# Patient Record
Sex: Female | Born: 1980 | Race: Black or African American | Hispanic: No | Marital: Single | Smoking: Current every day smoker
Health system: Southern US, Community
[De-identification: ages and names within clinical notes are randomized; demographics above are authoritative.]

## PROBLEM LIST (undated history)

## (undated) DIAGNOSIS — T1490XA Injury, unspecified, initial encounter: Secondary | ICD-10-CM

## (undated) DIAGNOSIS — D573 Sickle-cell trait: Secondary | ICD-10-CM

## (undated) DIAGNOSIS — F17200 Nicotine dependence, unspecified, uncomplicated: Secondary | ICD-10-CM

## (undated) DIAGNOSIS — D571 Sickle-cell disease without crisis: Secondary | ICD-10-CM

## (undated) DIAGNOSIS — N390 Urinary tract infection, site not specified: Secondary | ICD-10-CM

## (undated) HISTORY — DX: Sickle-cell disease without crisis: D57.1

## (undated) HISTORY — DX: Injury, unspecified, initial encounter: T14.90XA

## (undated) HISTORY — PX: TUBAL LIGATION: SHX77

## (undated) HISTORY — PX: APPENDECTOMY: SHX54

---

## 2007-08-06 ENCOUNTER — Emergency Department (HOSPITAL_COMMUNITY): Admission: EM | Admit: 2007-08-06 | Discharge: 2007-08-06 | Payer: Self-pay | Admitting: Emergency Medicine

## 2008-10-05 ENCOUNTER — Encounter: Payer: Self-pay | Admitting: Family Medicine

## 2008-11-08 ENCOUNTER — Emergency Department (HOSPITAL_COMMUNITY): Admission: EM | Admit: 2008-11-08 | Discharge: 2008-11-08 | Payer: Self-pay | Admitting: Family Medicine

## 2008-11-10 ENCOUNTER — Ambulatory Visit: Payer: Self-pay | Admitting: Family Medicine

## 2008-11-10 ENCOUNTER — Inpatient Hospital Stay (HOSPITAL_COMMUNITY): Admission: EM | Admit: 2008-11-10 | Discharge: 2008-11-12 | Payer: Self-pay | Admitting: Emergency Medicine

## 2008-11-20 ENCOUNTER — Telehealth: Payer: Self-pay | Admitting: Family Medicine

## 2008-12-14 ENCOUNTER — Ambulatory Visit: Payer: Self-pay | Admitting: Family Medicine

## 2008-12-14 DIAGNOSIS — D649 Anemia, unspecified: Secondary | ICD-10-CM | POA: Insufficient documentation

## 2008-12-14 DIAGNOSIS — K089 Disorder of teeth and supporting structures, unspecified: Secondary | ICD-10-CM | POA: Insufficient documentation

## 2009-03-14 ENCOUNTER — Telehealth: Payer: Self-pay | Admitting: *Deleted

## 2009-04-20 ENCOUNTER — Telehealth: Payer: Self-pay | Admitting: Family Medicine

## 2009-04-20 ENCOUNTER — Ambulatory Visit: Payer: Self-pay | Admitting: Family Medicine

## 2009-04-21 ENCOUNTER — Emergency Department (HOSPITAL_COMMUNITY): Admission: EM | Admit: 2009-04-21 | Discharge: 2009-04-21 | Payer: Self-pay | Admitting: Emergency Medicine

## 2009-05-28 ENCOUNTER — Encounter: Payer: Self-pay | Admitting: Family Medicine

## 2009-05-29 ENCOUNTER — Ambulatory Visit: Payer: Self-pay | Admitting: Family Medicine

## 2009-05-29 ENCOUNTER — Encounter: Payer: Self-pay | Admitting: Family Medicine

## 2009-05-29 DIAGNOSIS — N912 Amenorrhea, unspecified: Secondary | ICD-10-CM | POA: Insufficient documentation

## 2009-05-29 DIAGNOSIS — R61 Generalized hyperhidrosis: Secondary | ICD-10-CM | POA: Insufficient documentation

## 2009-05-29 LAB — CONVERTED CEMR LAB
ALT: <8 units/L (ref 0–35)
AST: 14 units/L (ref 0–37)
Albumin: 3.9 g/dL (ref 3.5–5.2)
Beta hcg, urine, semiquantitative: NEGATIVE
Calcium: 9.1 mg/dL (ref 8.4–10.5)
Chloride: 104 meq/L (ref 96–112)
Eosinophils Relative: 1 % (ref 0–5)
HCT: 33.6 % — ABNORMAL LOW (ref 36.0–46.0)
Lymphocytes Relative: 30 % (ref 12–46)
Lymphs Abs: 1.8 10*3/uL (ref 0.7–4.0)
Neutro Abs: 3.6 10*3/uL (ref 1.7–7.7)
Neutrophils Relative %: 61 % (ref 43–77)
Platelets: 373 10*3/uL (ref 150–400)
Potassium: 3.6 meq/L (ref 3.5–5.3)
Sodium: 140 meq/L (ref 135–145)
Total Protein: 7.6 g/dL (ref 6.0–8.3)
WBC: 5.9 10*3/uL (ref 4.0–10.5)

## 2009-05-30 ENCOUNTER — Encounter: Payer: Self-pay | Admitting: Family Medicine

## 2009-05-31 ENCOUNTER — Encounter: Payer: Self-pay | Admitting: Family Medicine

## 2009-05-31 ENCOUNTER — Telehealth: Payer: Self-pay | Admitting: Family Medicine

## 2009-06-05 ENCOUNTER — Telehealth (INDEPENDENT_AMBULATORY_CARE_PROVIDER_SITE_OTHER): Payer: Self-pay | Admitting: *Deleted

## 2009-07-16 ENCOUNTER — Encounter (INDEPENDENT_AMBULATORY_CARE_PROVIDER_SITE_OTHER): Payer: Self-pay | Admitting: *Deleted

## 2009-07-16 DIAGNOSIS — F17200 Nicotine dependence, unspecified, uncomplicated: Secondary | ICD-10-CM

## 2009-07-16 HISTORY — DX: Nicotine dependence, unspecified, uncomplicated: F17.200

## 2009-08-07 ENCOUNTER — Emergency Department (HOSPITAL_COMMUNITY): Admission: EM | Admit: 2009-08-07 | Discharge: 2009-08-07 | Payer: Self-pay | Admitting: Emergency Medicine

## 2010-08-13 NOTE — Miscellaneous (Signed)
Summary: Tobacco Cheryl Mcfarland  Clinical Lists Changes  Problems: Added new problem of TOBACCO Cheryl Mcfarland (ICD-305.1) 

## 2010-09-16 ENCOUNTER — Inpatient Hospital Stay (INDEPENDENT_AMBULATORY_CARE_PROVIDER_SITE_OTHER)
Admission: RE | Admit: 2010-09-16 | Discharge: 2010-09-16 | Disposition: A | Discharge status: Home / Self Care | Payer: Self-pay | Source: Ambulatory Visit | Attending: Family Medicine | Admitting: Family Medicine

## 2010-09-16 DIAGNOSIS — Z331 Pregnant state, incidental: Secondary | ICD-10-CM

## 2010-09-16 LAB — POCT URINALYSIS DIPSTICK
Hgb urine dipstick: NEGATIVE
Nitrite: NEGATIVE
Urobilinogen, UA: 0.2 mg/dL (ref 0.0–1.0)
pH: 6.5 (ref 5.0–8.0)

## 2010-10-08 ENCOUNTER — Inpatient Hospital Stay (HOSPITAL_COMMUNITY): Payer: Medicaid Other

## 2010-10-08 ENCOUNTER — Inpatient Hospital Stay (HOSPITAL_COMMUNITY)
Admission: AD | Admit: 2010-10-08 | Discharge: 2010-10-08 | Disposition: A | Discharge status: Home / Self Care | Payer: Medicaid Other | Source: Ambulatory Visit | Attending: Obstetrics & Gynecology | Admitting: Obstetrics & Gynecology

## 2010-10-08 DIAGNOSIS — O209 Hemorrhage in early pregnancy, unspecified: Secondary | ICD-10-CM

## 2010-10-08 DIAGNOSIS — N760 Acute vaginitis: Secondary | ICD-10-CM | POA: Insufficient documentation

## 2010-10-08 DIAGNOSIS — A499 Bacterial infection, unspecified: Secondary | ICD-10-CM | POA: Insufficient documentation

## 2010-10-08 DIAGNOSIS — O2390 Unspecified genitourinary tract infection in pregnancy, unspecified trimester: Secondary | ICD-10-CM

## 2010-10-08 DIAGNOSIS — B9689 Other specified bacterial agents as the cause of diseases classified elsewhere: Secondary | ICD-10-CM | POA: Insufficient documentation

## 2010-10-08 LAB — ABO/RH: ABO/RH(D): O POS

## 2010-10-08 LAB — WET PREP, GENITAL

## 2010-10-09 LAB — GC/CHLAMYDIA PROBE AMP, GENITAL: GC Probe Amp, Genital: NEGATIVE

## 2010-10-22 LAB — CBC
Hemoglobin: 10 g/dL — ABNORMAL LOW (ref 12.0–15.0)
MCHC: 33 g/dL (ref 30.0–36.0)
Platelets: 297 10*3/uL (ref 150–400)
RBC: 4.32 MIL/uL (ref 3.87–5.11)
RDW: 17.5 % — ABNORMAL HIGH (ref 11.5–15.5)
WBC: 8.5 10*3/uL (ref 4.0–10.5)

## 2010-10-22 LAB — HEMOGLOBIN A1C
Hgb A1c MFr Bld: 5.3 % (ref 4.6–6.1)
Mean Plasma Glucose: 105 mg/dL

## 2010-10-23 ENCOUNTER — Inpatient Hospital Stay (HOSPITAL_COMMUNITY)
Admission: AD | Admit: 2010-10-23 | Discharge: 2010-10-23 | Disposition: A | Discharge status: Home / Self Care | Payer: Medicaid Other | Source: Ambulatory Visit | Attending: Obstetrics & Gynecology | Admitting: Obstetrics & Gynecology

## 2010-10-23 ENCOUNTER — Inpatient Hospital Stay (HOSPITAL_COMMUNITY): Payer: Medicaid Other

## 2010-10-23 DIAGNOSIS — O209 Hemorrhage in early pregnancy, unspecified: Secondary | ICD-10-CM | POA: Insufficient documentation

## 2010-10-23 LAB — CBC
Hemoglobin: 11.4 g/dL — ABNORMAL LOW (ref 12.0–15.0)
MCHC: 33.1 g/dL (ref 30.0–36.0)
RBC: 4.54 MIL/uL (ref 3.87–5.11)
WBC: 9.5 10*3/uL (ref 4.0–10.5)

## 2010-10-23 LAB — DIFFERENTIAL
Lymphocytes Relative: 21 % (ref 12–46)
Lymphs Abs: 1.9 10*3/uL (ref 0.7–4.0)
Monocytes Absolute: 0.9 10*3/uL (ref 0.1–1.0)
Monocytes Relative: 10 % (ref 3–12)
Neutro Abs: 6.5 10*3/uL (ref 1.7–7.7)
Neutrophils Relative %: 69 % (ref 43–77)

## 2010-10-23 LAB — BASIC METABOLIC PANEL
CO2: 27 mEq/L (ref 19–32)
Calcium: 9 mg/dL (ref 8.4–10.5)
Creatinine, Ser: 0.84 mg/dL (ref 0.4–1.2)
GFR calc Af Amer: >60 mL/min (ref >60)
GFR calc non Af Amer: >60 mL/min (ref >60)
Sodium: 135 mEq/L (ref 135–145)

## 2010-10-23 LAB — HEMOGLOBIN A1C: Mean Plasma Glucose: 105 mg/dL

## 2010-10-23 LAB — POCT PREGNANCY, URINE: Preg Test, Ur: NEGATIVE

## 2010-11-15 ENCOUNTER — Ambulatory Visit (INDEPENDENT_AMBULATORY_CARE_PROVIDER_SITE_OTHER): Payer: Medicaid Other | Admitting: Sports Medicine

## 2010-11-15 ENCOUNTER — Other Ambulatory Visit: Payer: Medicaid Other

## 2010-11-15 ENCOUNTER — Encounter: Payer: Self-pay | Admitting: Sports Medicine

## 2010-11-15 VITALS — BP 128/88 | HR 85 | Temp 97.6°F | Ht 62.0 in | Wt 235.0 lb

## 2010-11-15 DIAGNOSIS — Z331 Pregnant state, incidental: Secondary | ICD-10-CM

## 2010-11-15 DIAGNOSIS — O219 Vomiting of pregnancy, unspecified: Secondary | ICD-10-CM

## 2010-11-15 LAB — POCT UA - MICROSCOPIC ONLY: Epithelial cells, urine per micros: >20

## 2010-11-15 LAB — POCT URINALYSIS DIPSTICK
Bilirubin, UA: NEGATIVE
Glucose, UA: NEGATIVE
Ketones, UA: NEGATIVE
Leukocytes, UA: NEGATIVE
Nitrite, UA: NEGATIVE

## 2010-11-15 MED ORDER — PRENATAL VITAMINS 0.8 MG PO TABS: 1.0000 | ORAL_TABLET | Freq: Every day | ORAL | Status: AC

## 2010-11-15 MED ORDER — ONDANSETRON 4 MG PO TBDP: 4.0000 mg | ORAL_TABLET | Freq: Three times a day (TID) | ORAL | Status: AC | PRN

## 2010-11-15 NOTE — Patient Instructions (Addendum)
Great to see you, Come back to see Dr. Rivka Safer for your prenatal care. Zofran for nausea. Avoid steaks when nauseated. Checking urine, will let you know if anything is abnormal. Take your prenatal vitamins. Come back if vaginal bleeding, leakage.  Ihor Austin. Benjamin Stain, M.D.

## 2010-11-15 NOTE — Assessment & Plan Note (Addendum)
Suspect mild hyperemesis gravidarum. Pt able to eat t-bone steak recently. Zofran ODT. Small meals. Handout given re: hyperemesis gravidarum. RTC prn.

## 2010-11-15 NOTE — Progress Notes (Signed)
Prenatal labs done today Marqui Formby 

## 2010-11-15 NOTE — Progress Notes (Addendum)
  Subjective:    Patient ID: Cheryl Mcfarland, female    DOB: 1980/09/05, 30 y.o.   MRN: [id number]  HPI 30 yo female with N/V.  Pregnant, 13.3 weeks.  Eating steak, able to keep down PO fluids.  Prenatal labs drawn today.  Taking PNV, has appt with Dr. Rivka Safer for prenatal care.  No fevers/chills, dysuria.  No vaginal bleeding, leakage, feeling baby move, no CTX.  Had OB US recently:  EDD: 05/20/11.   Review of Systems    See HPI Objective:   Physical Exam  Constitutional: She appears well-developed and well-nourished. No distress.  Abdominal: Soft. Bowel sounds are normal. She exhibits no distension and no mass. There is no tenderness. There is no rebound and no guarding.  Skin: Skin is warm and dry.   FHR: 145.       Assessment & Plan:

## 2010-11-15 NOTE — Progress Notes (Signed)
Addended by: [place], Ewell Benassi on: 11/15/2010 04:42 PM   Modules accepted: Orders

## 2010-11-16 LAB — OBSTETRIC PANEL
Antibody Screen: NEGATIVE
Basophils Absolute: 0 10*3/uL (ref 0.0–0.1)
Eosinophils Relative: 0 % (ref 0–5)
HCT: 35.6 % — ABNORMAL LOW (ref 36.0–46.0)
Hemoglobin: 12 g/dL (ref 12.0–15.0)
Lymphocytes Relative: 23 % (ref 12–46)
Lymphs Abs: 1.6 10*3/uL (ref 0.7–4.0)
MCV: 77.6 fL — ABNORMAL LOW (ref 78.0–100.0)
Monocytes Absolute: 0.8 10*3/uL (ref 0.1–1.0)
Neutro Abs: 4.4 10*3/uL (ref 1.7–7.7)
RBC: 4.59 MIL/uL (ref 3.87–5.11)
RDW: 16.2 % — ABNORMAL HIGH (ref 11.5–15.5)
Rubella: 50.6 IU/mL — ABNORMAL HIGH
WBC: 6.8 10*3/uL (ref 4.0–10.5)

## 2010-11-16 LAB — HIV ANTIBODY (ROUTINE TESTING W REFLEX): HIV: NONREACTIVE

## 2010-11-17 LAB — CULTURE, OB URINE: Organism ID, Bacteria: NO GROWTH

## 2010-11-19 ENCOUNTER — Other Ambulatory Visit (HOSPITAL_COMMUNITY)
Admission: RE | Admit: 2010-11-19 | Discharge: 2010-11-19 | Disposition: A | Discharge status: Home / Self Care | Payer: Medicaid Other | Source: Ambulatory Visit | Attending: Family Medicine | Admitting: Family Medicine

## 2010-11-19 ENCOUNTER — Ambulatory Visit (INDEPENDENT_AMBULATORY_CARE_PROVIDER_SITE_OTHER): Payer: Medicaid Other | Admitting: Family Medicine

## 2010-11-19 ENCOUNTER — Encounter: Payer: Self-pay | Admitting: Family Medicine

## 2010-11-19 VITALS — BP 113/72 | Temp 98.3°F | Wt 238.0 lb

## 2010-11-19 DIAGNOSIS — Z01419 Encounter for gynecological examination (general) (routine) without abnormal findings: Secondary | ICD-10-CM | POA: Insufficient documentation

## 2010-11-19 DIAGNOSIS — Z124 Encounter for screening for malignant neoplasm of cervix: Secondary | ICD-10-CM

## 2010-11-26 NOTE — H&P (Signed)
Cheryl, Mcfarland             ACCOUNT NO.:  [id number]   MEDICAL RECORD NO.:  [id number]          PATIENT TYPE:  INP   LOCATION:  5120                         FACILITY:  [facility]   PHYSICIAN:  Cheryl Ramp, MD        DATE OF BIRTH:  July 15, 1980   DATE OF ADMISSION:  11/10/2008  DATE OF DISCHARGE:                              HISTORY & PHYSICAL   PRIMARY CARE PHYSICIAN:  Unassigned.   CHIEF COMPLAINT:  Facial swelling.   HISTORY OF PRESENT ILLNESS:  The patient is a pleasant 30 year old  female with no past medical history that presented with left-sided  facial cellulitis that failed outpatient treatment penicillin and  Rocephin times one, 2 days ago.  The patient's CT was done in the  emergency department that showed asymmetric soft tissue swelling along  the lateral aspect of the mandible on the left consistent with  cellulitis.  It showed no abscess and showed widespread lymphadenopathy.  The patient states that she had tooth pain times 1 week with swelling  times 1 day.  She endorses headache and ear pain on the left side of her  face, along with some fever and chills.  She denies nausea, vomiting,  diarrhea, dizziness, throat pain, trouble swallowing or difficulty  breathing.   PAST MEDICAL HISTORY:  1. Asthma.  2. She is a G3, P3.   MEDICATIONS:  None.   ALLERGIES:  NONE.   PAST SURGICAL HISTORY:  1. Appendectomy.  2. Cesarean section.   SOCIAL HISTORY:  She lives in [place] with her fiance and three  children.  She is unemployed.  She admits to smoking one pack per week  times 3 years.  She drinks occasional alcohol and uses marijuana  recreationally   FAMILY HISTORY:  The patient has a family history of diabetes and  hypertension in both parents.   REVIEW OF SYSTEMS:  Per HPI, otherwise negative.   PHYSICAL EXAMINATION:  Temperature 98.6, pulse 83, respirations 20,  blood pressure 120s-150s systolic over 80s- 90s diastolic.  Pulse ox 99%  on room air.  GENERAL:  She is alert and oriented x3.  No apparent distress.  HEENT:  Normocephalic, atraumatic.  Pupils are equally round and  reactive to light.  Extraocular muscles are intact.  She does have left  facial edema that is erythematous and warm to touch.  It is tender to  palpation.  There is no drainage noted and no fluctuation felt on  palpation.  She does have numerous dental caries and left  lymphadenopathy.  HEART:  Regular rate and rhythm.  No murmurs, rubs or gallops.  LUNGS:  Clear to auscultation bilaterally.  ABDOMEN:  Soft, nontender, nondistended.  Bowel sounds x4.  EXTREMITIES:  No cyanosis, clubbing or edema.  NEURO EXAM:  Cranial nerves II-XII are intact.  No focal deficits are  noted.   LABS:  Urine pregnancy test is negative.  Maxillofacial CT showed  asymmetric soft tissue swelling along the lateral aspect of the mandible  on the left consistent with cellulitis.  No abscess.  Widespread  lymphadenopathy.   ASSESSMENT/PLAN:  The patient  is pleasant 30 year old [place] American  female with:  1. Left facial cellulitis that is evident on a CT.  She was initially      given vancomycin and Unasyn in the emergency department.  We will      discontinue Unasyn and switch to vancomycin.  There are no lesions      to cultures at this time.  We will plan on intravenous  antibiotics      for 24-48 hours depending on her response and then consider      switching to by mouth antibiotics such as Septra versus Keflex.  We      will check a CBC, BMET and A1c now.  Give her Percocet pain.  Ears,      nose and throat has already been called by the emergency department      physician and will consult.  We will await further recommendations      by them.  2. Fluids, electrolytes, nutrition and gastrointestinal:  Nothing by      mouth for now until evaluated by ears, nose and throat.  D5 normal      half-normal saline at 125 mL per hour.  3. Prophylaxis.  Proton-pump-inhibitor and  heparin.  4. Disposition:  Admit pending clinical status with the ears, nose and      throat consult.      Helane Rima, MD  Electronically Signed      Cheryl Ramp, MD  Electronically Signed    EW/MEDQ  D:  11/10/2008  T:  11/10/2008  Job:  [id number]

## 2010-11-26 NOTE — Discharge Summary (Signed)
NAMEAMMA, CREAR             ACCOUNT NO.:  [id number]   MEDICAL RECORD NO.:  [id number]          PATIENT TYPE:  INP   LOCATION:  5120                         FACILITY:  [facility]   PHYSICIAN:  Leighton Roach McDiarmid, M.D.DATE OF BIRTH:  03/08/1981   DATE OF ADMISSION:  11/10/2008  DATE OF DISCHARGE:  11/12/2008                               DISCHARGE SUMMARY   PRIMARY CARE Delissa Silba:  To establish [facility],  Dr. Milinda Antis.   DISCHARGE DIAGNOSES:  1. Left facial cellulitis.  2. Hypokalemia.  3. Obesity.  4. Mild microcytic anemia.   DISCHARGE MEDICATIONS:  1. Percocet 5/325 mg 1 p.o. q.4 h. p.r.n. pain.  2. Bactrim 160/800 mg 1 p.o. b.i.d. times 9 days.  3. Doxycycline 100 mg p.o. b.i.d. times 9 days.   DISCONTINUE MEDICATIONS:  Penicillin.   CONSULTS:  None.   PROCEDURES:  None.   IMAGING:  CT maxillofacial with contrast.  Impression, asymmetric soft  tissue swelling along the lateral aspect of the mandible on the left, no  frank abscess.  Unable to confirm or exclude periodontal disease,  widespread lymphadenopathy reactive in nature.   DISCHARGE LABORATORY FINDINGS:  Hemoglobin 10.0, hematocrit 30.0, white  count 5.0, MCV 75, platelets 258, hemoglobin A1c 5.3.  BMET, on November 10, 2008, sodium 135, potassium 3.1, BUN 7, and creatinine 0.84.   BRIEF HOSPITAL COURSE:  A 30 year old female with history of asthma  presented to emergency room status post failed outpatient treatment of  left facial cellulitis with penicillin and Rocephin x1.  1. Cellulitis.  The patient was admitted with left facial cellulitis.      CT scan was done which does show any evidence of acute frank      abscess.  However, cellulitis is consistent, which is soft tissue      swelling likely secondary to some dental infection, although frank      abscess was not found on physical exam.  The patient was started on      vancomycin and Zosyn after Unasyn was discontinued in  the emergency      department.  The patient received 24 hours of IV antibiotics with      best improvement of overall cellulitis.  The patient did not have      any evidence of respiratory distress and airway was patent.  The      patient was tolerating normal p.o. intake prior to discharge with      minimal pain, which was controlled with Percocet p.r.n.  As there      was no frank pus, there was no wound culture throughout for      patient's cellulitis.  Therefore, antibiotics were chosen based on      clinical exam and normal oral and skin pathogens.  The patient will      complete 10-day course of antibiotics to treat cellulitis, which      will include doxycycline and Bactrim to cover group A strep.  The      patient will continue Percocet p.r.n. pain.  Of note, the patient  was given number for dental service.  She states that she is      uninsured.  The patient will follow up with the [facility] with her new PCP.  If unable to obtain dental service at      that time, will be referred as an emergent dental procedure for      cavities which would likely patient's cellulitis.   1. Hypokalemia.  Upon admission, the patient was found to be      hypokalemic with potassium of 3.1, was given 4 runs of KCl.  BMET      was not rechecked and the patient is not on any potassium-wasting      medications.   1. Microcytic anemia.  The patient was found to have mild microcytic      anemia with hemoglobin of 10.9 on admission.  There is no evidence      of acute bleed, therefore this can be worked up as an outpatient.   DISCHARGE INSTRUCTIONS:  1. The patient is to complete 10-course of antibiotics, to return for      any difficulty breathing, unable to tolerate p.o., increased pain      or swelling in the face.  2. The patient will call [facility] and the Health      Department for access to dental clinic as the patient is uninsured.   FOLLOWUP:  Dr. Milinda Antis, [facility]  The  patient will call for appointment early next week.   DISPOSITION:  As the patient is uninsured, was referred to Social Work  and med assistance program was started with paperwork  during the  hospital.  The patient will complete any further requirements as needed  as an outpatient for medication assistance.  Of note, the patient was  given 3-day course of antibiotics prior to discharge which was given by  care management.   DISCHARGE CONDITION:  Stable/improved.   DISCHARGE LOCATION:  Home.       Milinda Antis, MD  Electronically Signed      Leighton Roach McDiarmid, M.D.  Electronically Signed    KD/MEDQ  D:  11/12/2008  T:  11/13/2008  Job:  [id number]

## 2010-11-28 ENCOUNTER — Encounter: Payer: Self-pay | Admitting: Family Medicine

## 2010-12-11 ENCOUNTER — Ambulatory Visit (INDEPENDENT_AMBULATORY_CARE_PROVIDER_SITE_OTHER): Payer: Medicaid Other | Admitting: Family Medicine

## 2010-12-11 VITALS — BP 114/75 | Temp 98.3°F | Wt 241.8 lb

## 2010-12-11 DIAGNOSIS — Z3480 Encounter for supervision of other normal pregnancy, unspecified trimester: Secondary | ICD-10-CM

## 2010-12-11 NOTE — Progress Notes (Signed)
   Subjective:    Cheryl Mcfarland is a 30 y.o. female being seen today for her obstetrical visit. She is at [redacted]w[redacted]d gestation. Patient reports backache. Fetal movement: normal.  Menstrual History: OB History    Grav Para Term Preterm Abortions TAB SAB Ect Mult Living   5 4 4  0 0 0 0 0 0 3      Menarche age: see previous note Patient's last menstrual period was 08/04/2010.    The following portions of the patient's history were reviewed and updated as appropriate: allergies, current medications, past family history, past medical history, past social history, past surgical history and problem list.  Review of Systems Pertinent items are noted in HPI.   Objective:     BP 114/75  Temp 98.3 F (36.8 C)  Wt 241 lb 12.8 oz (109.68 kg)  LMP 08/04/2010 Uterine Size: size equals dates  PE: Lungs: CTA CARDIAC: RRR ABD: SOFT, uterus gravid Pelvic exam: not performed EXT: non-tender, no edema, no clonus  Assessment:    Pregnancy 17 and 1/7 weeks   Plan:    Problem list reviewed and updated. Labs reviewed. AFP3 discussed: declined. Role of ultrasound in pregnancy discussed; fetal survey: requested. Amniocentesis discussed: declined. Follow up in 4 weeks.

## 2010-12-18 ENCOUNTER — Encounter: Payer: Self-pay | Admitting: Family Medicine

## 2010-12-18 NOTE — Progress Notes (Signed)
Note reviewed.  Pt needs early glucola given her positive family history of diabetes in first degree relative. Needs documentation of fetal heart tones.   Needs prenatal physical documented.   Should have counseling for sickle cell positive trait.   Will need repeat section.

## 2010-12-23 ENCOUNTER — Telehealth: Payer: Self-pay | Admitting: Family Medicine

## 2010-12-23 ENCOUNTER — Telehealth: Payer: Self-pay | Admitting: *Deleted

## 2010-12-23 NOTE — Telephone Encounter (Signed)
OB & has a cold and wants to know if she can take Mucinex

## 2010-12-23 NOTE — Telephone Encounter (Signed)
Called pt and advised, it's ok to take Mucinex during pregnancy (per Dr.Hensel) .Cheryl Mcfarland

## 2011-01-07 ENCOUNTER — Ambulatory Visit (INDEPENDENT_AMBULATORY_CARE_PROVIDER_SITE_OTHER): Payer: Medicaid Other | Admitting: Family Medicine

## 2011-01-07 VITALS — BP 122/81 | Wt 244.4 lb

## 2011-01-07 DIAGNOSIS — O30009 Twin pregnancy, unspecified number of placenta and unspecified number of amniotic sacs, unspecified trimester: Secondary | ICD-10-CM

## 2011-01-07 DIAGNOSIS — Z9889 Other specified postprocedural states: Secondary | ICD-10-CM

## 2011-01-07 DIAGNOSIS — Z3490 Encounter for supervision of normal pregnancy, unspecified, unspecified trimester: Secondary | ICD-10-CM

## 2011-01-07 DIAGNOSIS — Z3689 Encounter for other specified antenatal screening: Secondary | ICD-10-CM

## 2011-01-07 DIAGNOSIS — Z1389 Encounter for screening for other disorder: Secondary | ICD-10-CM

## 2011-01-07 DIAGNOSIS — Z3480 Encounter for supervision of other normal pregnancy, unspecified trimester: Secondary | ICD-10-CM

## 2011-01-07 DIAGNOSIS — Z98891 History of uterine scar from previous surgery: Secondary | ICD-10-CM

## 2011-01-07 NOTE — Progress Notes (Signed)
   Subjective:    Cheryl Mcfarland is a 30 y.o. female being seen today for her obstetrical visit. She is at [redacted] wk gestation. Patient reports backache. Fetal movement: normal.  Menstrual History: OB History    Grav Para Term Preterm Abortions TAB SAB Ect Mult Living   5 4 4  0 0 0 0 0 0 3      Menarche age: see previous note Patient's last menstrual period was 08/04/2010.    The following portions of the patient's history were reviewed and updated as appropriate: allergies, current medications, past family history, past medical history, past social history, past surgical history and problem list.  Review of Systems Pertinent items are noted in HPI.   Objective:     BP 122/81  Wt 244 lb 6.4 oz (110.859 kg)  LMP 08/04/2010 Uterine Size: size equals dates  PE: Lungs: CTA CARDIAC: RRR ABD: SOFT, uterus gravid Pelvic exam: not performed EXT: non-tender, no edema, no clonus  Assessment:    Pregnancy 17 and 1/7 weeks   Plan:    Problem list reviewed and updated. Labs reviewed. AFP3 discussed: declined. Role of ultrasound in pregnancy discussed; fetal survey: requested. Amniocentesis discussed: declined. Breast Feeding not planned, wants bottle.  Planning for Tubal Ligation. Follow up in 4 weeks.

## 2011-01-08 ENCOUNTER — Encounter: Payer: Self-pay | Admitting: Family Medicine

## 2011-01-08 ENCOUNTER — Telehealth: Payer: Self-pay | Admitting: Family Medicine

## 2011-01-08 DIAGNOSIS — Z98891 History of uterine scar from previous surgery: Secondary | ICD-10-CM | POA: Insufficient documentation

## 2011-01-08 NOTE — Progress Notes (Signed)
   Subjective:    Cheryl Mcfarland is a 30 y.o. female being seen today for her obstetrical visit. She is at [redacted] wk gestation. Patient reports backache. Fetal movement: normal.  Menstrual History: OB History    Grav Para Term Preterm Abortions TAB SAB Ect Mult Living   5 4 4  0 0 0 0 0 0 3      Menarche age: see previous note Patient's last menstrual period was 08/04/2010.    The following portions of the patient's history were reviewed and updated as appropriate: allergies, current medications, past family history, past medical history, past social history, past surgical history and problem list.  Review of Systems Pertinent items are noted in HPI.   Objective:     BP 122/81  Wt 244 lb 6.4 oz (110.859 kg)  LMP 08/04/2010 Uterine Size: size equals dates  PE: Lungs: CTA CARDIAC: RRR ABD: SOFT, uterus gravid Pelvic exam: not performed EXT: non-tender, no edema, no clonus  Assessment:    Pregnancy 21 weeks. For future C-section.  Plan:    Problem list reviewed and updated. Labs reviewed. AFP3 discussed: declined. Role of ultrasound in pregnancy discussed; fetal survey: requested. Amniocentesis discussed: declined. Breast Feeding not planned, wants bottle.  Planning for Tubal Ligation. Patient will come to Lab for 1 hour glucola per Dr. [place] Follow up in 4 weeks.

## 2011-01-08 NOTE — Progress Notes (Signed)
Pt is a 30 yo G4P4 at 21 weeks.   1) Needs urgent glucola if not done yet.  Risk factors include: obesity, +FH of diabetes.   2) H/o section.  Will need OB visit at 30 weeks to schedule repeat section 3) H/o death of child (per old records, was murdered by baby's father).  Will need support services - qualifies for [facility] Care manager. 4) Supervision of pregnancy - appropriate growth.  Anatomy scan ordered.

## 2011-01-08 NOTE — Progress Notes (Signed)
Addended by: Edd Arbour on: 01/08/2011 06:19 PM   Modules accepted: Kipp Brood

## 2011-01-08 NOTE — Telephone Encounter (Signed)
Left message for patient to come to [facility] for a 1 hour Glucola test.

## 2011-01-08 NOTE — Progress Notes (Deleted)
Pt is a 30 yo G5P4

## 2011-01-09 ENCOUNTER — Telehealth: Payer: Self-pay | Admitting: Family Medicine

## 2011-01-09 DIAGNOSIS — O99210 Obesity complicating pregnancy, unspecified trimester: Secondary | ICD-10-CM

## 2011-01-09 NOTE — Telephone Encounter (Signed)
Spoke with pt.  She states she just got the message today about coming in for the one hour glucola.  She has had these before and knows how to do them.  We agreed that she will come in at 9 am on July 2 for the test.  Orders placed for test.  Will ask front desk to add to Monday's schedule.  Pt advised she will need to be here 1 hour.

## 2011-01-10 ENCOUNTER — Ambulatory Visit (HOSPITAL_COMMUNITY)
Admission: RE | Admit: 2011-01-10 | Discharge: 2011-01-10 | Disposition: A | Discharge status: Home / Self Care | Payer: Medicaid Other | Source: Ambulatory Visit | Attending: Family Medicine | Admitting: Family Medicine

## 2011-01-10 DIAGNOSIS — O09299 Supervision of pregnancy with other poor reproductive or obstetric history, unspecified trimester: Secondary | ICD-10-CM | POA: Insufficient documentation

## 2011-01-10 DIAGNOSIS — Z363 Encounter for antenatal screening for malformations: Secondary | ICD-10-CM | POA: Insufficient documentation

## 2011-01-10 DIAGNOSIS — O358XX0 Maternal care for other (suspected) fetal abnormality and damage, not applicable or unspecified: Secondary | ICD-10-CM | POA: Insufficient documentation

## 2011-01-10 DIAGNOSIS — Z3689 Encounter for other specified antenatal screening: Secondary | ICD-10-CM

## 2011-01-10 DIAGNOSIS — Z1389 Encounter for screening for other disorder: Secondary | ICD-10-CM | POA: Insufficient documentation

## 2011-01-10 DIAGNOSIS — O34219 Maternal care for unspecified type scar from previous cesarean delivery: Secondary | ICD-10-CM | POA: Insufficient documentation

## 2011-01-13 ENCOUNTER — Other Ambulatory Visit: Payer: Medicaid Other

## 2011-01-18 ENCOUNTER — Emergency Department (HOSPITAL_COMMUNITY)
Admission: EM | Admit: 2011-01-18 | Discharge: 2011-01-18 | Disposition: A | Discharge status: Home / Self Care | Payer: Medicaid Other | Attending: Emergency Medicine | Admitting: Emergency Medicine

## 2011-01-18 DIAGNOSIS — R51 Headache: Secondary | ICD-10-CM | POA: Insufficient documentation

## 2011-01-18 DIAGNOSIS — K089 Disorder of teeth and supporting structures, unspecified: Secondary | ICD-10-CM | POA: Insufficient documentation

## 2011-01-18 DIAGNOSIS — H9209 Otalgia, unspecified ear: Secondary | ICD-10-CM | POA: Insufficient documentation

## 2011-01-18 DIAGNOSIS — O9989 Other specified diseases and conditions complicating pregnancy, childbirth and the puerperium: Secondary | ICD-10-CM | POA: Insufficient documentation

## 2011-01-18 DIAGNOSIS — K1370 Unspecified lesions of oral mucosa: Secondary | ICD-10-CM | POA: Insufficient documentation

## 2011-01-18 DIAGNOSIS — K029 Dental caries, unspecified: Secondary | ICD-10-CM | POA: Insufficient documentation

## 2011-01-18 DIAGNOSIS — J45909 Unspecified asthma, uncomplicated: Secondary | ICD-10-CM | POA: Insufficient documentation

## 2011-01-19 ENCOUNTER — Emergency Department (HOSPITAL_COMMUNITY)
Admission: EM | Admit: 2011-01-19 | Discharge: 2011-01-19 | Disposition: A | Discharge status: Home / Self Care | Payer: Medicaid Other | Attending: Emergency Medicine | Admitting: Emergency Medicine

## 2011-01-19 DIAGNOSIS — Z79899 Other long term (current) drug therapy: Secondary | ICD-10-CM | POA: Insufficient documentation

## 2011-01-19 DIAGNOSIS — D573 Sickle-cell trait: Secondary | ICD-10-CM | POA: Insufficient documentation

## 2011-01-19 DIAGNOSIS — K089 Disorder of teeth and supporting structures, unspecified: Secondary | ICD-10-CM | POA: Insufficient documentation

## 2011-01-19 DIAGNOSIS — J45909 Unspecified asthma, uncomplicated: Secondary | ICD-10-CM | POA: Insufficient documentation

## 2011-01-21 ENCOUNTER — Other Ambulatory Visit: Payer: Medicaid Other

## 2011-01-22 ENCOUNTER — Other Ambulatory Visit: Payer: Medicaid Other

## 2011-01-22 LAB — GLUCOSE, CAPILLARY: Glucose-Capillary: 116 mg/dL — ABNORMAL HIGH (ref 70–99)

## 2011-01-22 NOTE — Progress Notes (Signed)
1 hr gtt done today [facility] Cheryl Mcfarland

## 2011-02-06 ENCOUNTER — Encounter: Payer: Medicaid Other | Admitting: Family Medicine

## 2011-02-07 ENCOUNTER — Ambulatory Visit (INDEPENDENT_AMBULATORY_CARE_PROVIDER_SITE_OTHER): Payer: Medicaid Other | Admitting: Family Medicine

## 2011-02-07 DIAGNOSIS — O219 Vomiting of pregnancy, unspecified: Secondary | ICD-10-CM

## 2011-02-07 DIAGNOSIS — Z3480 Encounter for supervision of other normal pregnancy, unspecified trimester: Secondary | ICD-10-CM

## 2011-02-07 LAB — GLUCOSE, CAPILLARY: Glucose-Capillary: 94 mg/dL (ref 70–99)

## 2011-02-07 LAB — CBC
HCT: 31.7 % — ABNORMAL LOW (ref 36.0–46.0)
MCH: 25.1 pg — ABNORMAL LOW (ref 26.0–34.0)
MCHC: 32.8 g/dL (ref 30.0–36.0)
MCV: 76.6 fL — ABNORMAL LOW (ref 78.0–100.0)
RDW: 14.1 % (ref 11.5–15.5)

## 2011-02-07 NOTE — Patient Instructions (Signed)
It was great to meet you today. You and baby are doing great. Please schedule a follow up OB visit with Dr. Rivka Safer in 4 weeks. Thanks, Dr. Sherron Flemings Sondra Come

## 2011-02-08 NOTE — Progress Notes (Signed)
Pt is a 30 yo G4P4 at 25.3 weeks: 1) Repeated 1 hr GTT performed today due to lab error.  Will follow up results.  2) H/o section. Will need OB visit at 30 weeks to schedule repeat section  3) H/o death of child.  Per patient, baby was murdered by father and then father committed suicide.  Patient lives in a safe environment now.     Will likely benefit from a [facility] Care manager. 4) Will obtain CBC, RPR, and HIV labs today. 5) PTL precautions and kick counts reviewed.  RTC with Dr. Rivka Safer or myself in 4 weeks.

## 2011-02-08 NOTE — Assessment & Plan Note (Signed)
[id number] presents at 25.3 weeks. No complaints/concerns. Will get CBC, RPR, and HIV labs today. PTL and kick counts reviewed. Return to clinic in 4 weeks w/ Dr. Rivka Safer or myself. Will need PHQ-9 filled out at next visit.

## 2011-03-05 ENCOUNTER — Ambulatory Visit (INDEPENDENT_AMBULATORY_CARE_PROVIDER_SITE_OTHER): Payer: Medicaid Other | Admitting: Family Medicine

## 2011-03-05 DIAGNOSIS — J069 Acute upper respiratory infection, unspecified: Secondary | ICD-10-CM

## 2011-03-05 DIAGNOSIS — Z9889 Other specified postprocedural states: Secondary | ICD-10-CM

## 2011-03-05 DIAGNOSIS — Z98891 History of uterine scar from previous surgery: Secondary | ICD-10-CM

## 2011-03-05 NOTE — Assessment & Plan Note (Signed)
Productive cough likely secondary to viral URI vs. GERD in pregnancy. Symptomatic relief for now.  Encouraged pushing fluids and plenty of rest. May take OTC cough drops or nasal saline spray for congestion. Patient to call MD if she develops fever, chills, sweats, vomiting or diarrhea. Patient agreed with plan.

## 2011-03-05 NOTE — Assessment & Plan Note (Signed)
Will schedule OB appointment after [redacted] weeks GA for repeat C/S.

## 2011-03-05 NOTE — Progress Notes (Signed)
Today patient is [redacted]w[redacted]d GA. Will schedule OB visit in 1 week to schedule repeat C/S. Will discuss [facility] care manager at next visit.  Patient seems to be coping well.  Good relationship with new husband. 1 hr GTT 116. Performed GC/Chlamydia cultures today. PTL precautions, kick counts reviewed. Return to clinic in 4 weeks.

## 2011-03-05 NOTE — Patient Instructions (Signed)
We will call [facility] and schedule an appointment for repeat C/S. Please return to clinic in 4 weeks.

## 2011-03-06 ENCOUNTER — Telehealth: Payer: Self-pay | Admitting: *Deleted

## 2011-03-06 NOTE — Telephone Encounter (Signed)
Called and gave pt appt information and told her that it is imperative that if she cannot make this appt to please call and cancel/reschedule. Pt understood and agreed.Cheryl Mcfarland, Cheryl Mcfarland

## 2011-03-06 NOTE — Telephone Encounter (Signed)
Pt has an appt 08.27.2012 @ 1015 AM with Dr. Debroah Loop in his office at [facility] ground floor. If she cannot keep this appt she will need to call to cancel/reschedule [phone].Laureen Ochs, Viann Shove

## 2011-03-06 NOTE — Telephone Encounter (Signed)
Could not lvm. Pt vm stated that she was unavailable. Will try again later.Laureen Ochs, Viann Shove

## 2011-03-10 DIAGNOSIS — Z0371 Encounter for suspected problem with amniotic cavity and membrane ruled out: Secondary | ICD-10-CM

## 2011-03-14 ENCOUNTER — Inpatient Hospital Stay (HOSPITAL_COMMUNITY)
Admission: AD | Admit: 2011-03-14 | Discharge: 2011-03-14 | Disposition: A | Discharge status: Home / Self Care | Payer: Medicaid Other | Source: Ambulatory Visit | Attending: Obstetrics and Gynecology | Admitting: Obstetrics and Gynecology

## 2011-03-14 ENCOUNTER — Encounter (HOSPITAL_COMMUNITY): Payer: Self-pay | Admitting: *Deleted

## 2011-03-14 DIAGNOSIS — Z0371 Encounter for suspected problem with amniotic cavity and membrane ruled out: Secondary | ICD-10-CM

## 2011-03-14 HISTORY — DX: Sickle-cell trait: D57.3

## 2011-03-14 HISTORY — DX: Urinary tract infection, site not specified: N39.0

## 2011-03-14 LAB — WET PREP, GENITAL
Clue Cells Wet Prep HPF POC: NONE SEEN
Trich, Wet Prep: NONE SEEN

## 2011-03-14 NOTE — ED Provider Notes (Addendum)
Chief Complaint:  Labor Eval   Cheryl Mcfarland is  30 y.o. G5P4003 at [redacted]w[redacted]d presents complaining of Labor Eval .pt states she felt a "gush of clear liquid" at 1320/1330 but with continued leakage.  Last intercourse this am.   She states none, contractions associated with no vaginal bleeding., clear fluid, along with active Denies any additional symptoms such as headache, vision changes, or RUQ pain.  Obstetrical/Gynecological History: OB History    Grav Para Term Preterm Abortions TAB SAB Ect Mult Living   5 4 4  0 0 0 0 0 0 3     Denies STIs or HSV Past Medical History: Past Medical History  Diagnosis Date  . Sickle cell anemia     trait  . Trauma     1997-1999  . Sickle cell trait   . Asthma     more as a child  . Urinary tract infection     Past Surgical History: Past Surgical History  Procedure Date  . Appendectomy   . Cesarean section     x4    Family History: Family History  Problem Relation Age of Onset  . Diabetes Mother   . Hyperlipidemia Mother   . Hyperlipidemia Father     Social History: History  Substance Use Topics  . Smoking status: Former Games developer  . Smokeless tobacco: Not on file  . Alcohol Use: No    Allergies: No Known Allergies  Prescriptions prior to admission  Medication Sig Dispense Refill  . OVER THE COUNTER MEDICATION Take 1 tablet by mouth as needed. Patient takes Zantac as needed for heartburn      . Prenatal Multivit-Min-Fe-FA (PRENATAL VITAMINS) 0.8 MG tablet Take 1 tablet by mouth daily.  30 tablet  11    Review of Systems - Negative except per HPI  Physical Exam   Blood pressure 118/78, pulse 99, temperature 98.3 F (36.8 C), temperature source Oral, resp. rate 20, height 5\' 2"  (1.575 m), weight 112.946 kg (249 lb), last menstrual period 08/04/2010.  General: General appearance - alert, well appearing, and in no distress Chest - clear to auscultation, no wheezes, rales or rhonchi, symmetric air entry Heart - normal  rate, regular rhythm, normal S1, S2, no murmurs, rubs, clicks or gallops Abdomen - soft, nontender, nondistended, no masses or organomegaly Obese, vertex by leopolds, EFW 5 lbs? Extremities - trace pitting edema bilaterally FHTs: 140s bpm, mod var, +accel 15x15, ?occ decel but likely variability.  None contractions Cx: visually closed, neg pooling, wet prep and cultures taken.    Labs: No results found for this or any previous visit (from the past 24 hour(s)). Imaging Studies:  No results found.   Assessment: Cheryl Mcfarland is  30 y.o. G5P4003 at [redacted]w[redacted]d presents with ?ROM.  Plan: 1. Awaiting fern and cultures.  Anticipate d/c home if negative.   Erlin Gardella,LACHELLE8/31/20123:27 PM  Fern neg, wet prep unremarkable.  D/c home with precautions.   Oluwaferanmi Wain 4:00 PM 03/14/2011

## 2011-03-14 NOTE — Progress Notes (Signed)
Started leaking around 1330, clear fluid, still coming.  No bleeding.  Denies pain.

## 2011-03-15 ENCOUNTER — Inpatient Hospital Stay (HOSPITAL_COMMUNITY)
Admission: AD | Admit: 2011-03-15 | Discharge: 2011-04-04 | DRG: 765 | Disposition: A | Discharge status: Home / Self Care | Payer: Medicaid Other | Source: Ambulatory Visit | Attending: Obstetrics & Gynecology | Admitting: Obstetrics & Gynecology

## 2011-03-15 ENCOUNTER — Inpatient Hospital Stay (HOSPITAL_COMMUNITY): Payer: Medicaid Other

## 2011-03-15 ENCOUNTER — Encounter (HOSPITAL_COMMUNITY): Payer: Self-pay | Admitting: *Deleted

## 2011-03-15 DIAGNOSIS — O34219 Maternal care for unspecified type scar from previous cesarean delivery: Secondary | ICD-10-CM | POA: Diagnosis present

## 2011-03-15 DIAGNOSIS — O1205 Gestational edema, complicating the puerperium: Secondary | ICD-10-CM

## 2011-03-15 DIAGNOSIS — J069 Acute upper respiratory infection, unspecified: Secondary | ICD-10-CM | POA: Diagnosis present

## 2011-03-15 DIAGNOSIS — Z98891 History of uterine scar from previous surgery: Secondary | ICD-10-CM

## 2011-03-15 DIAGNOSIS — D649 Anemia, unspecified: Secondary | ICD-10-CM | POA: Diagnosis not present

## 2011-03-15 DIAGNOSIS — O212 Late vomiting of pregnancy: Secondary | ICD-10-CM | POA: Diagnosis present

## 2011-03-15 DIAGNOSIS — Z302 Encounter for sterilization: Secondary | ICD-10-CM

## 2011-03-15 DIAGNOSIS — O4290 Premature rupture of membranes, unspecified as to length of time between rupture and onset of labor, unspecified weeks of gestation: Principal | ICD-10-CM | POA: Diagnosis present

## 2011-03-15 DIAGNOSIS — O9903 Anemia complicating the puerperium: Secondary | ICD-10-CM | POA: Diagnosis not present

## 2011-03-15 DIAGNOSIS — S00521A Blister (nonthermal) of lip, initial encounter: Secondary | ICD-10-CM

## 2011-03-15 DIAGNOSIS — O99892 Other specified diseases and conditions complicating childbirth: Secondary | ICD-10-CM | POA: Diagnosis present

## 2011-03-15 LAB — GC/CHLAMYDIA PROBE AMP, GENITAL: Chlamydia, DNA Probe: NEGATIVE

## 2011-03-15 LAB — DIFFERENTIAL
Basophils Relative: 0 % (ref 0–1)
Eosinophils Absolute: 0 10*3/uL (ref 0.0–0.7)
Eosinophils Relative: 0 % (ref 0–5)
Monocytes Relative: 6 % (ref 3–12)
Neutrophils Relative %: 71 % (ref 43–77)

## 2011-03-15 LAB — CBC
MCH: 24.6 pg — ABNORMAL LOW (ref 26.0–34.0)
MCHC: 33.3 g/dL (ref 30.0–36.0)
MCV: 73.8 fL — ABNORMAL LOW (ref 78.0–100.0)
Platelets: 314 10*3/uL (ref 150–400)

## 2011-03-15 LAB — URINALYSIS, ROUTINE W REFLEX MICROSCOPIC
Leukocytes, UA: NEGATIVE
Nitrite: NEGATIVE
Specific Gravity, Urine: 1.01 (ref 1.005–1.030)
Urobilinogen, UA: 0.2 mg/dL (ref 0.0–1.0)

## 2011-03-15 LAB — AMNISURE RUPTURE OF MEMBRANE (ROM) NOT AT ARMC: Amnisure ROM: POSITIVE

## 2011-03-15 LAB — WET PREP, GENITAL
Clue Cells Wet Prep HPF POC: NONE SEEN
Trich, Wet Prep: NONE SEEN

## 2011-03-15 MED ORDER — ZOLPIDEM TARTRATE 10 MG PO TABS
10.0000 mg | ORAL_TABLET | Freq: Every evening | ORAL | Status: DC | PRN
  Administered 2011-03-23 – 2011-03-31 (×10): 10 mg via ORAL
  Filled 2011-03-15 (×10): qty 1

## 2011-03-15 MED ORDER — AZITHROMYCIN 500 MG PO TABS
1000.0000 mg | ORAL_TABLET | Freq: Once | ORAL | Status: AC
  Administered 2011-03-20: 1000 mg via ORAL
  Filled 2011-03-15: qty 2

## 2011-03-15 MED ORDER — AZITHROMYCIN 500 MG PO TABS
1000.0000 mg | ORAL_TABLET | Freq: Once | ORAL | Status: AC
  Administered 2011-03-15: 1000 mg via ORAL
  Filled 2011-03-15: qty 2

## 2011-03-15 MED ORDER — ACETAMINOPHEN 325 MG PO TABS
650.0000 mg | ORAL_TABLET | ORAL | Status: DC | PRN
  Administered 2011-03-19 – 2011-03-31 (×4): 650 mg via ORAL
  Filled 2011-03-15 (×4): qty 2

## 2011-03-15 MED ORDER — PRENATAL PLUS 27-1 MG PO TABS
1.0000 | ORAL_TABLET | Freq: Every day | ORAL | Status: DC
  Administered 2011-03-15 – 2011-04-04 (×20): 1 via ORAL
  Filled 2011-03-15 (×24): qty 1

## 2011-03-15 MED ORDER — AMOXICILLIN 500 MG PO CAPS
500.0000 mg | ORAL_CAPSULE | Freq: Three times a day (TID) | ORAL | Status: AC
  Administered 2011-03-17 – 2011-03-22 (×15): 500 mg via ORAL
  Filled 2011-03-15 (×15): qty 1

## 2011-03-15 MED ORDER — DOCUSATE SODIUM 100 MG PO CAPS
100.0000 mg | ORAL_CAPSULE | Freq: Every day | ORAL | Status: DC
  Administered 2011-03-15 – 2011-04-04 (×17): 100 mg via ORAL
  Filled 2011-03-15 (×25): qty 1

## 2011-03-15 MED ORDER — CALCIUM CARBONATE ANTACID 500 MG PO CHEW
2.0000 | CHEWABLE_TABLET | ORAL | Status: DC | PRN
  Administered 2011-03-16 – 2011-03-18 (×3): 400 mg via ORAL
  Filled 2011-03-15 (×5): qty 2

## 2011-03-15 MED ORDER — LACTATED RINGERS IV SOLN
INTRAVENOUS | Status: DC
  Administered 2011-03-15 – 2011-03-18 (×8): via INTRAVENOUS
  Administered 2011-03-19: 125 mL/h via INTRAVENOUS

## 2011-03-15 MED ORDER — BETAMETHASONE SOD PHOS & ACET 6 (3-3) MG/ML IJ SUSP
12.0000 mg | INTRAMUSCULAR | Status: AC
  Administered 2011-03-15 – 2011-03-16 (×2): 12 mg via INTRAMUSCULAR
  Filled 2011-03-15 (×2): qty 2

## 2011-03-15 MED ORDER — ERYTHROMYCIN BASE 250 MG PO TABS: 250.0000 mg | ORAL_TABLET | Freq: Four times a day (QID) | ORAL | Status: DC

## 2011-03-15 MED ORDER — SODIUM CHLORIDE 0.9 % IV SOLN
2.0000 g | Freq: Four times a day (QID) | INTRAVENOUS | Status: AC
  Administered 2011-03-15 – 2011-03-17 (×8): 2 g via INTRAVENOUS
  Filled 2011-03-15 (×17): qty 2000

## 2011-03-15 MED ORDER — SODIUM CHLORIDE 0.9 % IV SOLN
250.0000 mg | Freq: Four times a day (QID) | INTRAVENOUS | Status: DC
  Filled 2011-03-15 (×8): qty 250

## 2011-03-15 NOTE — Progress Notes (Signed)
Patient was seen yesterday for r/o rupture, was not not ruptured, has continued to leak

## 2011-03-15 NOTE — ED Provider Notes (Signed)
Cheryl Mcfarland is a 30 y.o. female presenting for rupture of membranes. Maternal Medical History:  Reason for admission: Reason for admission: rupture of membranes.  Patient seen for rupture of membranes.  She felt a big gush of fluid yesterday and was evaluated here.  A fern test was negative.  She has continued to leak fluid and felt another big gush of fluid today.   Contractions: Denies contractions.  Fetal activity: Perceived fetal activity is normal.    Prenatal complications: No bleeding, HIV, hypertension or infection.   Prenatal Complications - Diabetes: none.    OB History    Grav Para Term Preterm Abortions TAB SAB Ect Mult Living   5 4 4 0 0 0 0 0 0 3     Past Medical History  Diagnosis Date  . Sickle cell anemia     trait  . Trauma     1997-1999  . Sickle cell trait   . Asthma     more as a child  . Urinary tract infection    Past Surgical History  Procedure Date  . Appendectomy   . Cesarean section     x4   Family History: family history includes Diabetes in her mother and Hyperlipidemia in her father and mother. Social History:  reports that she has quit smoking. She does not have any smokeless tobacco history on file. She reports that she does not drink alcohol or use illicit drugs.  Review of Systems  Constitutional: Negative for fever, chills and weight loss.  Respiratory: Negative for sputum production, shortness of breath and wheezing.   Genitourinary: Negative for dysuria, urgency, frequency, hematuria and flank pain.  All other systems reviewed and are negative.      Blood pressure 121/75, pulse 84, temperature 98 F (36.7 C), temperature source Oral, resp. rate 16, height 5' 2" (1.575 m), weight 112.492 kg (248 lb), last menstrual period 08/04/2010.   Fetal Exam Fetal Monitor Review: Mode: hand-held doppler probe.   Baseline rate: 130.  Variability: moderate (6-25 bpm).   Pattern: accelerations present and no decelerations.    Fetal  State Assessment: Category I - tracings are normal.     Physical Exam  Constitutional: She appears well-developed and well-nourished.  HENT:  Head: Normocephalic and atraumatic.  Eyes: Conjunctivae are normal. Pupils are equal, round, and reactive to light.  Neck: Normal range of motion. Neck supple.  Cardiovascular: Normal rate, regular rhythm and normal heart sounds.  Exam reveals no gallop and no friction rub.   No murmur heard. Respiratory: Breath sounds normal. No respiratory distress. She has no wheezes. She has no rales. She exhibits no tenderness.  GI: Soft. Bowel sounds are normal. She exhibits no distension and no mass. There is no tenderness. There is no rebound and no guarding.    Amnisure is positive.  Prenatal labs: ABO, Rh: O/POS/-- (05/04 1102) Antibody: NEG (05/04 1102) Rubella:   RPR: NON REAC (07/27 1427)  HBsAg: NEGATIVE (05/04 1102)  HIV: NON REACTIVE (07/27 1427)  GBS:     Assessment/Plan: 1.  Premature ROM Will admit patient to antepartum and observe.  Will obtain CBC with diff, UA, GBS, GC/Chlamydia, wet prep and obtain a NICU consult.  We will start Ampicillin and erythmicin.  Will obtain complete US, including AFI, weight, BPP. STINSON, JACOB JEHIEL 03/15/2011, 6:44 PM     

## 2011-03-15 NOTE — H&P (Signed)
Cheryl Mcfarland is a 30 y.o. female presenting for rupture of membranes. Maternal Medical History:  Reason for admission: Reason for admission: rupture of membranes.  Patient seen for rupture of membranes.  She felt a big gush of fluid yesterday and was evaluated here.  A fern test was negative.  She has continued to leak fluid and felt another big gush of fluid today.   Contractions: Denies contractions.  Fetal activity: Perceived fetal activity is normal.    Prenatal complications: No bleeding, HIV, hypertension or infection.   Prenatal Complications - Diabetes: none.    OB History    Grav Para Term Preterm Abortions TAB SAB Ect Mult Living   5 4 4  0 0 0 0 0 0 3     Past Medical History  Diagnosis Date  . Sickle cell anemia     trait  . Trauma     1997-1999  . Sickle cell trait   . Asthma     more as a child  . Urinary tract infection    Past Surgical History  Procedure Date  . Appendectomy   . Cesarean section     x4   Family History: family history includes Diabetes in her mother and Hyperlipidemia in her father and mother. Social History:  reports that she has quit smoking. She does not have any smokeless tobacco history on file. She reports that she does not drink alcohol or use illicit drugs.  Review of Systems  Constitutional: Negative for fever, chills and weight loss.  Respiratory: Negative for sputum production, shortness of breath and wheezing.   Genitourinary: Negative for dysuria, urgency, frequency, hematuria and flank pain.  All other systems reviewed and are negative.      Blood pressure 121/75, pulse 84, temperature 98 F (36.7 C), temperature source Oral, resp. rate 16, height 5\' 2"  (1.575 m), weight 112.492 kg (248 lb), last menstrual period 08/04/2010.   Fetal Exam Fetal Monitor Review: Mode: hand-held doppler probe.   Baseline rate: 130.  Variability: moderate (6-25 bpm).   Pattern: accelerations present and no decelerations.    Fetal  State Assessment: Category I - tracings are normal.     Physical Exam  Constitutional: She appears well-developed and well-nourished.  HENT:  Head: Normocephalic and atraumatic.  Eyes: Conjunctivae are normal. Pupils are equal, round, and reactive to light.  Neck: Normal range of motion. Neck supple.  Cardiovascular: Normal rate, regular rhythm and normal heart sounds.  Exam reveals no gallop and no friction rub.   No murmur heard. Respiratory: Breath sounds normal. No respiratory distress. She has no wheezes. She has no rales. She exhibits no tenderness.  GI: Soft. Bowel sounds are normal. She exhibits no distension and no mass. There is no tenderness. There is no rebound and no guarding.    Amnisure is positive.  Prenatal labs: ABO, Rh: O/POS/-- (05/04 1102) Antibody: NEG (05/04 1102) Rubella:   RPR: NON REAC (07/27 1427)  HBsAg: NEGATIVE (05/04 1102)  HIV: NON REACTIVE (07/27 1427)  GBS:     Assessment/Plan: 1.  Premature ROM Will admit patient to antepartum and observe.  Will obtain CBC with diff, UA, GBS, GC/Chlamydia, wet prep and obtain a NICU consult.  We will start Ampicillin and erythmicin.  Will obtain complete US, including AFI, weight, BPP. Candelaria Celeste JEHIEL 03/15/2011, 6:44 PM

## 2011-03-15 NOTE — ED Notes (Signed)
SROM confirmed by amnosure. MD explained to pt POC to have her admitted for  steriod and antibiotics. Pt agreeable to plan

## 2011-03-16 NOTE — Progress Notes (Signed)
FACULTY PRACTICE ANTEPARTUM(COMPREHENSIVE) NOTE  Cheryl Mcfarland is a 30 y.o. G5P4003 at [redacted]w[redacted]d  who is admitted for PPROM.   Fetal presentation is cephalic, however patient has a history of four prior cesarean sections. Length of Stay:  1  Days  Subjective: No complaints. Patient reports good fetal movement.  She reports no uterine contractions, no bleeding and small amount of loss of fluid per vagina.  Vitals:  Blood pressure 142/89, pulse 106, temperature 98.6 F (37 C), temperature source Oral, resp. rate 20, height 5\' 2"  (1.575 m), weight 112.946 kg (249 lb), last menstrual period 08/04/2010. Physical Examination:  General appearance - alert, well appearing, and in no distress Abdomen - soft, nontender, nondistended, no masses or organomegaly Fundal Height:  size equals dates Pelvic Exam:  examination not indicated Cervical Exam: Not evaluated.  Extremities: extremities normal, atraumatic, no cyanosis or edema with DTRs 2+ bilaterally Membranes:ruptured, clear fluid  Fetal Monitoring:  Baseline: 130 bpm, Variability: moderate, Accelerations: Reactive, Decelerations: Absent and overall reassuring  Labs:  Recent Results (from the past 24 hour(s))  AMNISURE RUPTURE OF MEMBRANE (ROM)   Collection Time   03/15/11  5:33 PM      Component Value Range   Amnisure ROM POSITIVE    URINALYSIS, ROUTINE W REFLEX MICROSCOPIC   Collection Time   03/15/11  8:15 PM      Component Value Range   Color, Urine YELLOW  YELLOW    Appearance CLEAR  CLEAR    Specific Gravity, Urine 1.010  1.005 - 1.030    pH 7.0  5.0 - 8.0    Glucose, UA NEGATIVE  NEGATIVE (mg/dL)   Hgb urine dipstick NEGATIVE  NEGATIVE    Bilirubin Urine NEGATIVE  NEGATIVE    Ketones, ur 15 (*) NEGATIVE (mg/dL)   Protein, ur NEGATIVE  NEGATIVE (mg/dL)   Urobilinogen, UA 0.2  0.0 - 1.0 (mg/dL)   Nitrite NEGATIVE  NEGATIVE    Leukocytes, UA NEGATIVE  NEGATIVE   WET PREP, GENITAL   Collection Time   03/15/11  8:15 PM   Component Value Range   Yeast, Wet Prep NONE SEEN  NONE SEEN    Trich, Wet Prep NONE SEEN  NONE SEEN    Clue Cells, Wet Prep NONE SEEN  NONE SEEN    WBC, Wet Prep HPF POC NONE SEEN  NONE SEEN   CBC   Collection Time   03/15/11 10:15 PM      Component Value Range   WBC 7.3  4.0 - 10.5 (K/uL)   RBC 3.74 (*) 3.87 - 5.11 (MIL/uL)   Hemoglobin 9.2 (*) 12.0 - 15.0 (g/dL)   HCT 16.1 (*) 09.6 - 46.0 (%)   MCV 73.8 (*) 78.0 - 100.0 (fL)   MCH 24.6 (*) 26.0 - 34.0 (pg)   MCHC 33.3  30.0 - 36.0 (g/dL)   RDW 04.5  40.9 - 81.1 (%)   Platelets 314  150 - 400 (K/uL)  DIFFERENTIAL   Collection Time   03/15/11 10:15 PM      Component Value Range   Neutrophils Relative 71  43 - 77 (%)   Neutro Abs 5.2  1.7 - 7.7 (K/uL)   Lymphocytes Relative 22  12 - 46 (%)   Lymphs Abs 1.6  0.7 - 4.0 (K/uL)   Monocytes Relative 6  3 - 12 (%)   Monocytes Absolute 0.5  0.1 - 1.0 (K/uL)   Eosinophils Relative 0  0 - 5 (%)   Eosinophils Absolute 0.0  0.0 - 0.7 (K/uL)   Basophils Relative 0  0 - 1 (%)   Basophils Absolute 0.0  0.0 - 0.1 (K/uL)    Imaging Studies:    AFI 7.15, cephalic, EFW 95%, normal placenta. Final read pending.   Medications:  Scheduled    . ampicillin (OMNIPEN) IV  2 g Intravenous Q6H x 48 hours   Followed by  . amoxicillin  500 mg Oral Q8H x 5 days  . azithromycin  1,000 mg Oral Once on 03/15/11   Followed by  . azithromycin  1,000 mg Oral Once on 03/20/11  . betamethasone acetate-betamethasone sodium phosphate  12 mg Intramuscular Q24H x 2 doses  . docusate sodium  100 mg Oral Daily prn  . prenatal vitamin w/FE, FA  1 tablet Oral Daily   I have reviewed the patient's current medications.  ASSESSMENT: Patient Active Problem List  Diagnoses  . TOBACCO USER  . Nausea/vomiting in pregnancy  . H/O: cesarean section  . URI (upper respiratory infection)  . Preterm Premature Rupture of Membranes (PPROM)    PLAN: No signs/symtoms of chorioamnionitis.  Will continue inhouse observation,  NST BID, tocometer as needed.  Continue latency antibiotics and complete BMZ course.  Follow up labs and manage accordingly. Continue routine antenatal care.   ANYANWU,UGONNA A 03/16/2011,7:16 AM

## 2011-03-16 NOTE — Progress Notes (Signed)
Alert acknowledged, Pt.'s nurse aware.

## 2011-03-16 NOTE — Plan of Care (Signed)
Problem: Consults Goal: Birthing Suites Patient Information Press F2 to bring up selections list  Outcome: Not Applicable Date Met:  03/16/11  Pt < [redacted] weeks EGA

## 2011-03-16 NOTE — Progress Notes (Signed)
Off monitor to shower.

## 2011-03-16 NOTE — Consult Note (Signed)
[facility]  Neonatal Medicine Consultation       03/16/2011    7:48 PM  I was called at the request of the patient's obstetrician (Dr. Adrian Blackwater) to speak to this patient due to premature rupture of membranes at [redacted] weeks gestation.  I reviewed the expected survival (excellent) and neurodevelopmental risk (low) for these babies, but encouraged her to adhere to her OB's treatment so that the pregnancy can be prolonged as long as possible.  I described the usual problems faced by such preterm infants, and answered the patient's questions.  _____________________ Electronically Signed By: Angelita Ingles, MD Neonatologist

## 2011-03-17 ENCOUNTER — Encounter (HOSPITAL_COMMUNITY): Payer: Self-pay | Admitting: Anesthesiology

## 2011-03-17 DIAGNOSIS — O34219 Maternal care for unspecified type scar from previous cesarean delivery: Secondary | ICD-10-CM

## 2011-03-17 DIAGNOSIS — O4290 Premature rupture of membranes, unspecified as to length of time between rupture and onset of labor, unspecified weeks of gestation: Secondary | ICD-10-CM

## 2011-03-17 DIAGNOSIS — O212 Late vomiting of pregnancy: Secondary | ICD-10-CM

## 2011-03-17 LAB — TYPE AND SCREEN
ABO/RH(D): O POS
Antibody Screen: NEGATIVE

## 2011-03-17 NOTE — Progress Notes (Signed)
FACULTY PRACTICE ANTEPARTUM(COMPREHENSIVE) NOTE  Cheryl Mcfarland is a 30 y.o. G5P4003 at [redacted]w[redacted]d  who is admitted for PPROM.   Fetal presentation is cephalic, however patient has a history of four prior cesarean sections. Length of Stay:  2  Days  Subjective: No complaints. Patient reports good fetal movement.  She reports no uterine contractions, no bleeding and small amount of loss of fluid per vagina.  Vitals:  Blood pressure 131/81, pulse 107, temperature 98.2 F (36.8 C), temperature source Oral, resp. rate 18, height 5\' 2"  (1.575 m), weight 112.946 kg (249 lb), last menstrual period 08/04/2010. Physical Examination: General appearance - alert, well appearing, and in no distress Abdomen - soft, nontender, nondistended, no masses or organomegaly.  Well healed Pfannenstiel incision. Fundal Height:  size equals dates Pelvic Exam:  examination not indicated Cervical Exam: Not evaluated.  Extremities: extremities normal, atraumatic, no cyanosis or edema with DTRs 2+ bilaterally Membranes:ruptured, clear fluid  Fetal Monitoring:  Baseline: 130 bpm, Variability: moderate, Accelerations: Reactive, Decelerations: Absent and overall reassuring  Imaging Studies:    03/15/11 OB U/S: AFI 7.2/oligohydramnios, cephalic, EFW 1744g/69%, anterior placenta, closed cervix 3.4 cm in length, BPP 8/10 (-2 for FBM; breathing seen but not sustained for 30 seconds).  Labs:   Results for orders placed during the hospital encounter of 03/15/11 (from the past 48 hour(s))  AMNISURE RUPTURE OF MEMBRANE (ROM)     Status: Normal   Collection Time   03/15/11  5:33 PM      Component Value Range Comment   Amnisure ROM POSITIVE     URINALYSIS, ROUTINE W REFLEX MICROSCOPIC     Status: Abnormal   Collection Time   03/15/11  8:15 PM      Component Value Range Comment   Color, Urine YELLOW  YELLOW     Appearance CLEAR  CLEAR     Specific Gravity, Urine 1.010  1.005 - 1.030     pH 7.0  5.0 - 8.0     Glucose, UA  NEGATIVE  NEGATIVE (mg/dL)    Hgb urine dipstick NEGATIVE  NEGATIVE     Bilirubin Urine NEGATIVE  NEGATIVE     Ketones, ur 15 (*) NEGATIVE (mg/dL)    Protein, ur NEGATIVE  NEGATIVE (mg/dL)    Urobilinogen, UA 0.2  0.0 - 1.0 (mg/dL)    Nitrite NEGATIVE  NEGATIVE     Leukocytes, UA NEGATIVE  NEGATIVE  MICROSCOPIC NOT DONE ON URINES WITH NEGATIVE PROTEIN, BLOOD, LEUKOCYTES, NITRITE, OR GLUCOSE <1000 mg/dL.  WET PREP, GENITAL     Status: Normal   Collection Time   03/15/11  8:15 PM      Component Value Range Comment   Yeast, Wet Prep NONE SEEN  NONE SEEN     Trich, Wet Prep NONE SEEN  NONE SEEN     Clue Cells, Wet Prep NONE SEEN  NONE SEEN     WBC, Wet Prep HPF POC NONE SEEN  NONE SEEN  FEW BACTERIA SEEN  CBC     Status: Abnormal   Collection Time   03/15/11 10:15 PM      Component Value Range Comment   WBC 7.3  4.0 - 10.5 (K/uL)    RBC 3.74 (*) 3.87 - 5.11 (MIL/uL)    Hemoglobin 9.2 (*) 12.0 - 15.0 (g/dL)    HCT 16.1 (*) 09.6 - 46.0 (%)    MCV 73.8 (*) 78.0 - 100.0 (fL)    MCH 24.6 (*) 26.0 - 34.0 (pg)    MCHC  33.3  30.0 - 36.0 (g/dL)    RDW 40.9  81.1 - 91.4 (%)    Platelets 314  150 - 400 (K/uL)   DIFFERENTIAL     Status: Normal   Collection Time   03/15/11 10:15 PM      Component Value Range Comment   Neutrophils Relative 71  43 - 77 (%)    Neutro Abs 5.2  1.7 - 7.7 (K/uL)    Lymphocytes Relative 22  12 - 46 (%)    Lymphs Abs 1.6  0.7 - 4.0 (K/uL)    Monocytes Relative 6  3 - 12 (%)    Monocytes Absolute 0.5  0.1 - 1.0 (K/uL)    Eosinophils Relative 0  0 - 5 (%)    Eosinophils Absolute 0.0  0.0 - 0.7 (K/uL)    Basophils Relative 0  0 - 1 (%)    Basophils Absolute 0.0  0.0 - 0.1 (K/uL)   TYPE AND SCREEN     Status: Normal   Collection Time   03/17/11 11:40 AM      Component Value Range Comment   ABO/RH(D) O POS      Antibody Screen NEG      Sample Expiration 03/20/2011      Medications:  Scheduled   . ampicillin (OMNIPEN) IV  2 g Intravenous Q6H x 48 hours   Followed by    . amoxicillin  500 mg Oral Q8H x 5 days  . azithromycin  1,000 mg Oral Once on 03/15/11   Followed by  . azithromycin  1,000 mg Oral Once on 03/20/11  . betamethasone acetate-betamethasone sodium phosphate  12 mg Intramuscular Q24H x 2 doses  . docusate sodium  100 mg Oral Daily prn  . prenatal vitamin w/FE, FA  1 tablet Oral Daily   I have reviewed the patient's current medications.  ASSESSMENT: Diagnoses  . Preterm Premature Rupture of Membranes (PPROM)  . H/O: cesarean section  . TOBACCO USER    PLAN: No signs/symptoms of chorioamnionitis.  Will continue inhouse observation, NST BID, tocometer as needed.  Continue latency antibiotics and complete BMZ course today.  Normal labs thus far, GC/Chlam pending. Reassuring FHR tracing.  Given patient's history of four prior cesarean sections; cesarean section is indicated for delivery.  It was re-emphasized that she needs inpatient observation because she is at elevated risk for complications secondary to PPROM such as chorioamnionitis, placental abruption, umbilical cord prolapse, maternal or fetal distress; and these may constitute indications for delivery.  Given that this will be her fifth cesarean section, she will likely need an infraumbilical vertical skin incision (last four cesareans were done via Pfannenstiel) and this was discussed with the patient. The risks of repeat cesarean section discussed with the patient included but were not limited to: increased difficulty of procedure secondary to possible adhesive disease from prior surgeries, bleeding which may require transfusion or reoperation; infection which may require antibiotics; injury to bowel, bladder, ureters or other surrounding organs; injury to the fetus; need for additional procedures including hysterectomy in the event of a life-threatening hemorrhage; placental abnormalities with subsequent pregnancies, incisional problems, thromboembolic phenomenon and other  postoperative/anesthesia complications. The patient concurred with the proposed plan, giving informed written consent for the procedure for when delivery is indicated.    Patient also desires BTL.  Other reversible forms of contraception were discussed with patient; she declines all other modalities. Additional risks of procedure discussed with patient including but not limited to: risk of regret, permanence  of method, bleeding, infection, injury to surrounding organs and need for additional procedures.  Failure risk of 0.5-1% with increased risk of ectopic gestation if pregnancy occurs was also discussed with patient.  She concurred with the proposed plan, giving informed written consent for the procedure for when delivery is indicated.  She understands that this procedure may not be performed in the case of emergency/fetal distress; but she emphasized that she does not want any more children, no matter the outcome of this pregnancy.  Consents were signed and placed in the patient's physical chart; they are valid for 30 days. Patient will sign the Medicaid consent tomorrow and this will also be placed in her physical chart.  Anesthesia consult ordered for patient given her history; Dr. Cristela Blue was informed of need for consultation.    Will continue routine antenatal care.   Cheryl Mcfarland A 03/17/2011,12:59 PM

## 2011-03-17 NOTE — Anesthesia Preprocedure Evaluation (Deleted)
Anesthesia Evaluation  General Assessment Comment  Airway Mallampati: II TM Distance: >3 FB Neck ROM: full    Dental  (+) Teeth Intact   Pulmonary  asthma    no wheezing none    Cardiovascular     Neuro/Psych   GI/Hepatic/Renal   Endo/Other  (+)   Morbid obesity  Abdominal   Musculoskeletal   Hematology   Peds  Reproductive/Obstetrics    Anesthesia Other Findings Pre-op today @ 30/6 weeks No inhaler use CS x4..........3 CLE, 1 GA- no difficulty           Anesthesia Physical Anesthesia Plan  ASA: III  Anesthesia Plan: Regional   Post-op Pain Management:    Induction:   Airway Management Planned:   Additional Equipment:   Intra-op Plan:   Post-operative Plan:   Informed Consent:   Plan Discussed with:   Anesthesia Plan Comments:         Anesthesia Quick Evaluation

## 2011-03-18 MED ORDER — FAMOTIDINE 20 MG PO TABS
20.0000 mg | ORAL_TABLET | Freq: Two times a day (BID) | ORAL | Status: DC
  Administered 2011-03-18 – 2011-04-04 (×33): 20 mg via ORAL
  Filled 2011-03-18 (×35): qty 1

## 2011-03-18 NOTE — Progress Notes (Signed)
FACULTY PRACTICE ANTEPARTUM(COMPREHENSIVE) NOTE  Cheryl Mcfarland is a 30 y.o. G5P4003 at [redacted]w[redacted]d  who is admitted for PPROM.   Fetal presentation is cephalic, however patient has a history of four prior cesarean sections. Length of Stay:  3  Days  Subjective: No complaints. Patient reports good fetal movement.  She reports no uterine contractions, no bleeding and small amount of loss of fluid per vagina.  Vitals:  Blood pressure 116/75, pulse 92, temperature 98 F (36.7 C), temperature source Oral, resp. rate 20, height 5\' 2"  (1.575 m), weight 112.946 kg (249 lb), last menstrual period 08/04/2010. Physical Examination: General appearance - alert, well appearing, and in no distress Abdomen - soft, nontender, nondistended, no masses or organomegaly.  Well healed Pfannenstiel incision. Fundal Height:  size equals dates Pelvic Exam:  examination not indicated Cervical Exam: Not evaluated.  Extremities: extremities normal, atraumatic, no cyanosis or edema with DTRs 2+ bilaterally Membranes:ruptured, clear fluid  Fetal Monitoring:  Baseline: 130 bpm, Variability: moderate, Accelerations: Reactive, Decelerations: Absent and overall reassuring  Imaging Studies:    03/15/11 OB U/S: AFI 7.2/oligohydramnios, cephalic, EFW 1744g/69%, anterior placenta, closed cervix 3.4 cm in length, BPP 8/10 (-2 for FBM; breathing seen but not sustained for 30 seconds).  Labs:   Results for orders placed during the hospital encounter of 03/15/11 (from the past 48 hour(s))  TYPE AND SCREEN     Status: Normal   Collection Time   03/17/11 11:40 AM      Component Value Range Comment   ABO/RH(D) O POS      Antibody Screen NEG      Sample Expiration 03/20/2011      Medications:  Scheduled   . ampicillin (OMNIPEN) IV  2 g Intravenous Q6H x 48 hours   Followed by  . amoxicillin  500 mg Oral Q8H x 5 days  . azithromycin  1,000 mg Oral Once on 03/15/11   Followed by  . azithromycin  1,000 mg Oral Once on 03/20/11  .  betamethasone acetate-betamethasone sodium phosphate  12 mg Intramuscular Q24H x 2 doses  . docusate sodium  100 mg Oral Daily prn  . prenatal vitamin w/FE, FA  1 tablet Oral Daily   I have reviewed the patient's current medications.  ASSESSMENT: Diagnoses  . Preterm Premature Rupture of Membranes (PPROM)  . H/O: cesarean section  . TOBACCO USER    PLAN: No signs/symptoms of chorioamnionitis.  Will continue inhouse observation, NST BID, tocometer as needed.  Continue latency antibiotics; BMZ course completed on 03/17/11.  Normal labs thus far, GC/Chlam and GBS pending. Reassuring FHR tracing. Anesthesia consult done on 03/17/11.  Patient counseled regarding repeat cesarean section and BTL; consents signed 93/12 and are in physical chart.  Will sign tubal papers today. Will continue routine antenatal care.   ANYANWU,UGONNA A 03/18/2011,8:00 AM

## 2011-03-19 LAB — CULTURE, BETA STREP (GROUP B ONLY)

## 2011-03-19 MED ORDER — SODIUM CHLORIDE 0.9 % IJ SOLN
3.0000 mL | Freq: Two times a day (BID) | INTRAMUSCULAR | Status: DC
  Administered 2011-03-19 – 2011-03-21 (×4): 3 mL via INTRAVENOUS

## 2011-03-19 NOTE — Progress Notes (Signed)
S: Leaking small amount of pink tinged odorless fluid. Few cramps in R low back while up to shower, resolved w/ rest. + FM. Denies chills, calf pain, cough, SOB. + BM--loose yesterday possibly from diet.   O: Temp:  [97.8 F (36.6 C)-98.7 F (37.1 C)] 98.4 F (36.9 C) (09/05 1015) Pulse Rate:  [70-118] 70  (09/05 1015) Resp:  [18-24] 18  (09/05 1015) BP: (113-135)/(66-81) 128/69 mmHg (09/05 1015) Weight:  [114.715 kg (252 lb 14.4 oz)] 252 lb 14.4 oz (114.715 kg) (09/05 1015)  Heart: RRR, no M, R or G Lungs: CTAB ABD: soft, NT, gradvid, S=D Pelvic: deferred EXT: -edema or masses. DTRs 2+, no clonus, - homan's EFM: 150's, mod variability, + accels, - decels Toco: no UC's  A:  1. 31.1 week IUP 2. PPROM, on oral ABX for latency, mild cramping 3. FHR category I  P:  1. Monitor x 1 hour. Observe for UC's. 2. If minimal UC's, will consult w/ attending if OK for NSL. 3. Otherwise continue current POC

## 2011-03-20 LAB — TYPE AND SCREEN: Antibody Screen: NEGATIVE

## 2011-03-20 NOTE — Progress Notes (Signed)
Still slight leakage noted, clear, no odor, occasional contraction, good fetal movement  Filed Vitals:   03/19/11 2204 03/19/11 2331 03/20/11 0130 03/20/11 0824  BP:  133/68  128/70  Pulse:  76  87  Temp:  98.1 F (36.7 C)  98 F (36.7 C)  TempSrc:  Oral  Oral  Resp: 18 20 20 18   Height:      Weight:        NAD, pleasant  Abdomen gravid non-tender CBC    Component Value Date/Time   WBC 7.3 03/15/2011 2215   RBC 3.74* 03/15/2011 2215   HGB 9.2* 03/15/2011 2215   HCT 27.6* 03/15/2011 2215   PLT 314 03/15/2011 2215   MCV 73.8* 03/15/2011 2215   MCH 24.6* 03/15/2011 2215   MCHC 33.3 03/15/2011 2215   RDW 14.9 03/15/2011 2215   LYMPHSABS 1.6 03/15/2011 2215   MONOABS 0.5 03/15/2011 2215   EOSABS 0.0 03/15/2011 2215   BASOSABS 0.0 03/15/2011 2215    Imp: [redacted]w[redacted]d   PPSROM. Stable  Plan: Continue present management

## 2011-03-20 NOTE — Progress Notes (Signed)
UR Chart review completed.  

## 2011-03-21 ENCOUNTER — Ambulatory Visit (HOSPITAL_COMMUNITY): Payer: Medicaid Other

## 2011-03-21 NOTE — Progress Notes (Signed)
S: Patient is 30-yo G5P4003 at 31.[redacted] wks EGA who is admitted for PPROM. She has been feeling well, has no complaints. States she had her [place] this AM and baby was measuring around 4 pounds for her weight. Her name will be Cheryl Mcfarland. She does not plan on breastfeeding. She states she has signed her BTL papers and still wants her tubes tied.  O Filed Vitals:   03/21/11 0751  BP: 132/74  Pulse: 87  Temp: 98 F (36.7 C)  Resp: 24   Gen: AAO, NAD Heart: RRR, no murmurs Lungs: CTA B/L Abd: +BS, soft Ext: +pulses, no edema NST 9/6: FHT 140's, +Accels, no decels, no contractions; Cat 1  A/P IUP at 31.3 wks with PPROM 1. Today is day 7/7 of antibiotics, no signs of chorio presently 2. S/P BMZ x 2 3. [place] results from today still pending 4. Category 1 Tracing, will continue current monitoring 5. Will schedule for repeat c/s and BTL on 25 Sept when patient is [redacted] wks EGA 6. Otherwise continue current care plans.

## 2011-03-22 NOTE — Progress Notes (Signed)
  the patient has no complaints of pain, fever, contractions. Minimal leakage of fluid, no bleeding. Good fetal movement. Filed Vitals:   03/21/11 1947 03/21/11 2308 03/22/11 0239 03/22/11 0633  BP: 138/87 123/76 135/79 114/59  Pulse: 86 86 88 84  Temp: 98.1 F (36.7 C) 97.8 F (36.6 C) 98.4 F (36.9 C) 98.1 F (36.7 C)  TempSrc: Oral Oral Oral Oral  Resp: 18 18 18 18   Height:      Weight:       No distress, alert  Abdomen gravid not tender Ext no swelling, not tender FEtal monitoring 03/21/11 reviewed, reactive, no decels Imp: [redacted]w[redacted]d         PPSROM, no labor, normal growth  Plan Continue expectant management

## 2011-03-23 LAB — TYPE AND SCREEN
ABO/RH(D): O POS
Antibody Screen: NEGATIVE

## 2011-03-23 NOTE — Progress Notes (Signed)
The patient has no complaints of pain, fever, contractions. Minimal leakage of fluid, no bleeding.  Good fetal movement.  Filed Vitals:   03/22/11 1629 03/22/11 2019 03/23/11 0008 03/23/11 0407  BP: 124/81 103/80 135/74   Pulse: 99 122 92   Temp: 97.4 F (36.3 C) 98.6 F (37 C) 98 F (36.7 C) 98.1 F (36.7 C)  TempSrc: Oral Oral Oral Oral  Resp: 20 20 18    Height:      Weight:        No distress, alert  Abdomen gravid not tender  Ext no swelling, not tender  Fetal monitoring 03/22/11 reviewed, reactive, no decels  Imp:[redacted]w[redacted]d  PPSROM, no labor, normal growth by [place] Plan Continue expectant management

## 2011-03-24 NOTE — Progress Notes (Signed)
UR chart review completed.  

## 2011-03-24 NOTE — Progress Notes (Signed)
  FACULTY PRACTICE ANTEPARTUM(COMPREHENSIVE) NOTE  Cheryl Mcfarland is a 30 y.o. G5P4003 at [redacted]w[redacted]d who is admitted for PPROM.   Length of Stay:  9  Days  Subjective:  Patient reports the fetal movement as active. Patient reports uterine contraction  activity as none. Patient reports  vaginal bleeding as none. Patient describes fluid per vagina as Clear.  Vitals:  Blood pressure 141/76, pulse 92, temperature 98.5 F (36.9 C), temperature source Oral, resp. rate 20, height 5\' 2"  (1.575 m), weight 114.715 kg (252 lb 14.4 oz), last menstrual period 08/04/2010. Physical Examination:  General appearance - alert, well appearing, and in no distress Fundal Height:  Nontender Extremities: Homans sign is negative, no sign of DVT   Fetal Monitoring:  Reactive, no decels   Medications:  Scheduled    . docusate sodium  100 mg Oral Daily  . famotidine  20 mg Oral BID  . prenatal vitamin w/FE, FA  1 tablet Oral Daily   I have reviewed the patient's current medications.  ASSESSMENT: Patient Active Problem List  Diagnoses  . TOBACCO USER  . Nausea/vomiting in pregnancy  . H/O: cesarean section  . URI (upper respiratory infection)  . Preterm Premature Rupture of Membranes (PPROM)    PLAN: PPROM at 31 weeks and 6 days.  No signs of chorioamnionitis.  Scheduled for c/s + BTL at 34 weeks  Marelly Wehrman H. 03/24/2011,7:14 AM

## 2011-03-25 NOTE — Progress Notes (Signed)
FACULTY PRACTICE ANTEPARTUM PROGRESS NOTE  Cheryl Mcfarland is a 30 y.o. G5P4003 at [redacted]w[redacted]d who is admitted for PPROM.   Length of Stay:  10  Days  Subjective: Patient reports the fetal movement as active. Patient reports uterine contraction  activity as none. Patient reports  vaginal bleeding as none. Patient describes fluid per vagina as Clear.  Vitals:  Blood pressure 130/81, pulse 104, temperature 97.4 F (36.3 C), temperature source Oral, resp. rate 18, height 5\' 2"  (1.575 m), weight 114.715 kg (252 lb 14.4 oz), last menstrual period 08/04/2010. Physical Examination: General appearance - alert, well appearing, and in no distress Abdomen:  Nontender Extremities: Homans sign is negative, no sign of DVT   Fetal Monitoring:  Reactive, no decels.  No contractions   Medications:  Scheduled    . docusate sodium  100 mg Oral Daily  . famotidine  20 mg Oral BID  . prenatal vitamin w/FE, FA  1 tablet Oral Daily  I have reviewed the patient's current medications.  Imaging: 03/15/11 EFW 1744g/69%, AFI 7.15 cm 03/21/11 EFW 1799g/59%, AFI 3.32 cm, cervix 4.65cm in length and closed  Assessment: Patient Active Problem List  Diagnoses  . TOBACCO USER  . Nausea/vomiting in pregnancy  . H/O: cesarean section  . URI (upper respiratory infection)  . Preterm Premature Rupture of Membranes (PPROM)    Plan: Patient has completed betamethasone and latency antibiotic regimens. Weekly AFI; ordered for  03/28/11.   No signs/symptoms of chorioamnionitis or maternal/fetal distress.  Scheduled for RCS+ BTL at 34 weeks (04/08/11 at 1130 with Dr. Macon Large)  Cheryl Mcfarland A 03/25/2011,7:46 AM

## 2011-03-26 LAB — TYPE AND SCREEN: ABO/RH(D): O POS

## 2011-03-26 NOTE — Progress Notes (Signed)
Cheryl Mcfarland is a 30 y.o. G5P4003 at [redacted]w[redacted]d who is admitted for PPROM.  Length of Stay: 11 Days  Subjective:  Patient reports the fetal movement as active.  Patient reports uterine contraction activity as none.  Patient reports vaginal bleeding as none.  Patient describes fluid per vagina as Clear.   Filed Vitals:   03/25/11 1517 03/25/11 2024 03/25/11 2317 03/26/11 0636  BP: 144/93 136/79 136/78 113/66  Pulse: 89 89 91 93  Temp: 98.3 F (36.8 C) 97.9 F (36.6 C) 98 F (36.7 C) 98.1 F (36.7 C)  TempSrc:  Oral Oral Oral  Resp: 20 20 20 20   Height:      Weight:        Physical Examination:  General appearance - alert, well appearing, and in no distress  Abdomen: Nontender  Extremities: Homans sign is negative, no sign of DVT  Fetal Monitoring: Reactive, no decels. No contractions  Medications: Scheduled   .  docusate sodium  100 mg  Oral  Daily   .  famotidine  20 mg  Oral  BID   .  prenatal vitamin w/FE, FA  1 tablet  Oral  Daily   I have reviewed the patient's current medications.  Imaging:  03/15/11 EFW 1744g/69%, AFI 7.15 cm  03/21/11 EFW 1799g/59%, AFI 3.32 cm, cervix 4.65cm in length and closed  Assessment:  Patient Active Problem List   Diagnoses   .  TOBACCO USER   .  Nausea/vomiting in pregnancy   .  H/O: cesarean section   .  URI (upper respiratory infection)   .  Preterm Premature Rupture of Membranes (PPROM)    Plan:  Patient has completed betamethasone and latency antibiotic regimens.  Weekly AFI; ordered for 03/28/11.  No signs/symptoms of chorioamnionitis or maternal/fetal distress. Scheduled for RCS+ BTL at 34 weeks (04/08/11 at 1130 with Dr. Macon Large)

## 2011-03-27 NOTE — Progress Notes (Signed)
  Cheryl Mcfarland is a 30 y.o. G5P4003 at [redacted]w[redacted]d who is admitted for PPROM.  Length of Stay: 11 Days  Subjective: Pt reports small amount of clear fluid, no other complaints Patient reports the fetal movement as active.  Patient reports uterine contraction activity as none.  Patient reports vaginal bleeding as none.  Patient describes fluid per vagina as Clear.   Filed Vitals:   03/26/11 1516 03/26/11 1935 03/26/11 2159 03/27/11 0603  BP: 152/87 120/66 156/99 130/85  Pulse: 93 105 92 99  Temp: 98.5 F (36.9 C) 98 F (36.7 C) 97.9 F (36.6 C) 98 F (36.7 C)  TempSrc:  Oral Oral Oral  Resp: 18 20 20 20   Height:      Weight:        Physical Examination:  General appearance - alert, well appearing, and in no distress  Abdomen: Nontender  Extremities: Homans sign is negative, no sign of DVT  Fetal Monitoring: Reactive, no decels. No contractions  Medications: Scheduled   .  docusate sodium  100 mg  Oral  Daily   .  famotidine  20 mg  Oral  BID   .  prenatal vitamin w/FE, FA  1 tablet  Oral  Daily   I have reviewed the patient's current medications.  Imaging:  03/15/11 EFW 1744g/69%, AFI 7.15 cm  03/21/11 EFW 1799g/59%, AFI 3.32 cm, cervix 4.65cm in length and closed  Assessment:  Patient Active Problem List   Diagnoses   .  TOBACCO USER   .  Nausea/vomiting in pregnancy   .  H/O: cesarean section   .  URI (upper respiratory infection)   .  Preterm Premature Rupture of Membranes (PPROM)    Plan:  Patient has completed betamethasone and latency antibiotic regimens.  Weekly AFI; ordered for 03/28/11.  No signs/symptoms of chorioamnionitis or maternal/fetal distress. Scheduled for RCS+ BTL at 34 weeks (04/08/11 at 1130 with Dr. Macon Large)

## 2011-03-27 NOTE — Progress Notes (Signed)
UR Chart review completed.  

## 2011-03-28 ENCOUNTER — Inpatient Hospital Stay (HOSPITAL_COMMUNITY): Payer: Medicaid Other

## 2011-03-28 NOTE — Progress Notes (Signed)
  FACULTY PRACTICE ANTEPARTUM(COMPREHENSIVE) NOTE  Cheryl Mcfarland is a 30 y.o. G5P4003 at [redacted]w[redacted]d who is admitted for PPROM.   Fetal presentation is cephalic. Length of Stay:  13  Days  Subjective: Feels well; denies pain, bldg or ctx. Leaking clear fluid occasionally.   Vitals:  Blood pressure 136/81, pulse 100, temperature 97.8 F (36.6 C), temperature source Oral, resp. rate 20, height 5\' 2"  (1.575 m), weight 114.715 kg (252 lb 14.4 oz), last menstrual period 08/04/2010. Physical Examination:  General appearance - alert, well appearing, and in no distress  Pelvic Exam:  normal external genitalia, vulva, vagina, cervix, uterus and adnexa, examination not indicated Cervical Exam: Not evaluated Extremities: extremities normal, atraumatic, no cyanosis or edema with DTRs 2+ bilaterally Membranes: ruptured  Fetal Monitoring:  Baseline: 135 bpm; reactive and reassuring NST, no decels; no ctx per toco during NST   Labs:  No results found for this or any previous visit (from the past 24 hour(s)).   Medications:  Scheduled    . docusate sodium  100 mg Oral Daily  . famotidine  20 mg Oral BID  . prenatal vitamin w/FE, FA  1 tablet Oral Daily   I have reviewed the patient's current medications.  ASSESSMENT: Patient Active Problem List  Diagnoses  . TOBACCO USER  . Nausea/vomiting in pregnancy  . H/O: cesarean section  . URI (upper respiratory infection)  . Preterm Premature Rupture of Membranes (PPROM)    PLAN: Patient has completed betamethasone and latency antibiotic regimens.  Weekly AFI; ordered for today.  No signs/symptoms of chorioamnionitis or maternal/fetal distress. Scheduled for RCS+ BTL at 34 weeks (04/08/11 at 1130 with Dr. Macon Large)   Clelia Croft, [facility] 03/28/2011,6:57 AM

## 2011-03-28 NOTE — Progress Notes (Signed)
Admitted at  [redacted] weeks gestation, with PROM.  Height  62" Weight 252 Lbs  pre-pregnancy weight 238 lbs .Pre-pregnancy  BMI 43.5 ( obese)  IBW 110 lbs  Total weight gain 14 lbs. Weight gain goals11-20 lbs.   Estimated needs: 19-2100 kcal/day, 77-87 grams protein/day, 2.3 liters fluid/day regular diet tolerated well, appetite good. Noted Hct 27.6% 03/15/11, on PNV w/iron, but no additional iron which may be beneficial  Nutrition Dx: Increased nutrient needs r/t pregnancy and fetal growth requirements aeb [redacted] weeks gestation.  No educational needs assessed at this time.

## 2011-03-29 LAB — TYPE AND SCREEN: ABO/RH(D): O POS

## 2011-03-29 NOTE — Progress Notes (Signed)
  FACULTY PRACTICE ANTEPARTUM(COMPREHENSIVE) NOTE  Cheryl Mcfarland is a 30 y.o. G5P4003 at [redacted]w[redacted]d who is admitted for PPROM.   Fetal presentation is cephalic. Length of Stay:  14  Days  Subjective: Feels well; denies pain, bldg or ctx. Leaking clear fluid occasionally.   Vitals:  Blood pressure 137/91, pulse 100, temperature 98.8 F (37.1 C), temperature source Oral, resp. rate 20, height 5\' 2"  (1.575 m), weight 114.715 kg (252 lb 14.4 oz), last menstrual period 08/04/2010. Physical Examination:  General appearance - alert, well appearing, and in no distress  Pelvic Exam:  normal external genitalia, vulva, vagina, cervix, uterus and adnexa, examination not indicated Cervical Exam: Not evaluated Extremities: extremities normal, atraumatic, no cyanosis or edema with DTRs 2+ bilaterally Membranes: ruptured  Fetal Monitoring:  Baseline 140; reactive and reassuring NST, no decels; no ctx per toco during NST   Labs:  No results found for this or any previous visit (from the past 24 hour(s)).   Medications:  Scheduled    . docusate sodium  100 mg Oral Daily  . famotidine  20 mg Oral BID  . prenatal vitamin w/FE, FA  1 tablet Oral Daily   I have reviewed the patient's current medications.  ASSESSMENT: Patient Active Problem List  Diagnoses  . TOBACCO USER  . Nausea/vomiting in pregnancy  . H/O: cesarean section  . URI (upper respiratory infection)  . Preterm Premature Rupture of Membranes (PPROM)    PLAN: - Patient has completed betamethasone and latency antibiotic regimens.  - No signs/symptoms of chorioamnionitis or maternal/fetal distress.  - Continue close monitoring - Scheduled for RCS+ BTL at 34 weeks (04/08/11 at 1130 with Dr. Macon Large)   Jocelyn Nold 03/29/2011,8:26 AM

## 2011-03-30 NOTE — Progress Notes (Signed)
   FACULTY PRACTICE ANTEPARTUM(COMPREHENSIVE) NOTE  Cheryl Mcfarland is a 30 y.o. G5P4003 at 107w4d who is admitted for PPROM.   Fetal presentation is cephalic. Length of Stay:  15  Days  Subjective: Feels well; denies pain, bldg or ctx. Leaking clear fluid occasionally. Positive fetal movement.   Vitals:  Blood pressure 136/77, pulse 107, temperature 97.9 F (36.6 C), temperature source Oral, resp. rate 18, height 5\' 2"  (1.575 m), weight 114.715 kg (252 lb 14.4 oz), last menstrual period 08/04/2010. Physical Examination:  General appearance - alert, well appearing, and in no distress Abd: S/gravid/NT Pelvic Exam:  normal external genitalia, vulva, vagina, cervix, uterus and adnexa, examination not indicated Cervical Exam: Not evaluated Extremities: extremities normal, atraumatic, no cyanosis or edema with DTRs 2+ bilaterally Membranes: ruptured  Fetal Monitoring:  Baseline 140; reactive and reassuring NST, no decels; no ctx per toco during NST   Labs:  Recent Results (from the past 24 hour(s))  TYPE AND SCREEN   Collection Time   03/29/11 10:45 AM      Component Value Range   ABO/RH(D) O POS     Antibody Screen NEG     Sample Expiration 04/01/2011       Medications:  Scheduled    . docusate sodium  100 mg Oral Daily  . famotidine  20 mg Oral BID  . prenatal vitamin w/FE, FA  1 tablet Oral Daily   I have reviewed the patient's current medications.  ASSESSMENT: Patient Active Problem List  Diagnoses  . TOBACCO USER  . Nausea/vomiting in pregnancy  . H/O: cesarean section  . URI (upper respiratory infection)  . Preterm Premature Rupture of Membranes (PPROM)    PLAN: - Patient has completed betamethasone and latency antibiotic regimens.  - No signs/symptoms of chorioamnionitis or maternal/fetal distress.  - Continue close monitoring - Scheduled for RCS+ BTL at 34 weeks   Cheryl Mcfarland 03/30/2011,6:45 AM

## 2011-03-31 LAB — CBC
HCT: 29.9 % — ABNORMAL LOW (ref 36.0–46.0)
MCH: 24 pg — ABNORMAL LOW (ref 26.0–34.0)
MCV: 74 fL — ABNORMAL LOW (ref 78.0–100.0)
Platelets: 286 10*3/uL (ref 150–400)
RBC: 4.04 MIL/uL (ref 3.87–5.11)
WBC: 8.6 10*3/uL (ref 4.0–10.5)

## 2011-03-31 LAB — DIFFERENTIAL
Eosinophils Absolute: 0 10*3/uL (ref 0.0–0.7)
Eosinophils Relative: 0 % (ref 0–5)
Lymphocytes Relative: 22 % (ref 12–46)
Lymphs Abs: 1.9 10*3/uL (ref 0.7–4.0)
Monocytes Absolute: 0.7 10*3/uL (ref 0.1–1.0)
Monocytes Relative: 8 % (ref 3–12)

## 2011-03-31 MED ORDER — LACTATED RINGERS IV SOLN
INTRAVENOUS | Status: DC
  Administered 2011-03-31 – 2011-04-02 (×3): via INTRAVENOUS

## 2011-03-31 NOTE — Progress Notes (Signed)
Moderate amount of bleeding noted to blue pad. Pt states she just recently changed the pad about 30 minutes ago. No pain or cramping.  Marlynn Perking, CNM notified. Stated she would come assess pt.

## 2011-03-31 NOTE — Progress Notes (Signed)
  Called by nurse to evaluate pt for painless vag bleeding. Apx 120-150cc of blood tinged fluid on pad. Sterile spec exam cervix viewed apx 1 cm/th/high. Scant amt serous vag bleeding from cervix. Progress report to dr. Penne Lash. Orders given.

## 2011-03-31 NOTE — Progress Notes (Signed)
    FACULTY PRACTICE ANTEPARTUM(COMPREHENSIVE) NOTE  Cheryl Mcfarland is a 30 y.o. G5P4003 at [redacted]w[redacted]d who is admitted for PPROM.   Fetal presentation is cephalic. Length of Stay:  17  Days  Subjective: Feels well; denies pain, bldg or ctx. Leaking clear fluid occasionally. Positive fetal movement.   Vitals: Afebrile, VSS Physical Examination:  General appearance - alert, well appearing, and in no distress Abd: S/gravid/NT Extremities: extremities normal, atraumatic, no cyanosis or edema with DTRs 2+ bilaterally Membranes: ruptured  Fetal Monitoring:  Baseline 140; reactive and reassuring NST, no decels; no ctx per toco during NST   Labs:  No results found for this or any previous visit (from the past 24 hour(s)).   Medications:  Scheduled    . docusate sodium  100 mg Oral Daily  . famotidine  20 mg Oral BID  . prenatal vitamin w/FE, FA  1 tablet Oral Daily   I have reviewed the patient's current medications.  ASSESSMENT: Patient Active Problem List  Diagnoses  . TOBACCO USER  . Nausea/vomiting in pregnancy  . H/O: cesarean section  . URI (upper respiratory infection)  . Preterm Premature Rupture of Membranes (PPROM)    PLAN: - s/p BMX, Abx..  - Stable maternal/fetal unit. - Continue close monitoring - Scheduled for RCS+ BTL at 34 weeks or for signs/symptoms of chorioamnionitis.   Cecille Mcclusky S 03/31/2011,6:25 AM

## 2011-03-31 NOTE — Progress Notes (Signed)
UR chart review completed.  

## 2011-04-01 ENCOUNTER — Encounter (HOSPITAL_COMMUNITY): Payer: Self-pay | Admitting: Anesthesiology

## 2011-04-01 ENCOUNTER — Encounter (HOSPITAL_COMMUNITY): Payer: Self-pay | Admitting: Neonatology

## 2011-04-01 ENCOUNTER — Encounter (HOSPITAL_COMMUNITY)
Admission: AD | Disposition: A | Discharge status: Home / Self Care | Payer: Self-pay | Source: Ambulatory Visit | Attending: Obstetrics & Gynecology

## 2011-04-01 ENCOUNTER — Other Ambulatory Visit: Payer: Self-pay | Admitting: Obstetrics & Gynecology

## 2011-04-01 DIAGNOSIS — Z302 Encounter for sterilization: Secondary | ICD-10-CM

## 2011-04-01 DIAGNOSIS — O34219 Maternal care for unspecified type scar from previous cesarean delivery: Secondary | ICD-10-CM

## 2011-04-01 DIAGNOSIS — O4290 Premature rupture of membranes, unspecified as to length of time between rupture and onset of labor, unspecified weeks of gestation: Secondary | ICD-10-CM

## 2011-04-01 LAB — TYPE AND SCREEN
ABO/RH(D): O POS
Antibody Screen: NEGATIVE

## 2011-04-01 SURGERY — Surgical Case
Anesthesia: Regional | Site: Abdomen | Laterality: Bilateral | Wound class: Clean Contaminated

## 2011-04-01 MED ORDER — ONDANSETRON HCL 4 MG/2ML IJ SOLN
INTRAMUSCULAR | Status: AC
  Filled 2011-04-01: qty 2

## 2011-04-01 MED ORDER — CITRIC ACID-SODIUM CITRATE 334-500 MG/5ML PO SOLN
ORAL | Status: AC
  Filled 2011-04-01: qty 15

## 2011-04-01 MED ORDER — OXYTOCIN 10 UNIT/ML IJ SOLN
INTRAMUSCULAR | Status: AC
  Filled 2011-04-01: qty 2

## 2011-04-01 MED ORDER — CEFAZOLIN SODIUM-DEXTROSE 2-3 GM-% IV SOLR
2.0000 g | INTRAVENOUS | Status: DC
  Filled 2011-04-01: qty 50

## 2011-04-01 MED ORDER — SIMETHICONE 80 MG PO CHEW: 80.0000 mg | CHEWABLE_TABLET | ORAL | Status: DC | PRN

## 2011-04-01 MED ORDER — KETOROLAC TROMETHAMINE 30 MG/ML IJ SOLN: 15.0000 mg | Freq: Once | INTRAMUSCULAR | Status: DC | PRN

## 2011-04-01 MED ORDER — ZOLPIDEM TARTRATE 5 MG PO TABS: 5.0000 mg | ORAL_TABLET | Freq: Every evening | ORAL | Status: DC | PRN

## 2011-04-01 MED ORDER — DIPHENHYDRAMINE HCL 25 MG PO CAPS
25.0000 mg | ORAL_CAPSULE | Freq: Four times a day (QID) | ORAL | Status: DC | PRN
  Administered 2011-04-01 – 2011-04-02 (×3): 25 mg via ORAL
  Filled 2011-04-01 (×3): qty 1

## 2011-04-01 MED ORDER — FERROUS SULFATE 325 (65 FE) MG PO TABS
325.0000 mg | ORAL_TABLET | ORAL | Status: DC
  Administered 2011-04-02 – 2011-04-04 (×4): 325 mg via ORAL
  Filled 2011-04-01 (×4): qty 1

## 2011-04-01 MED ORDER — PHENYLEPHRINE 40 MCG/ML (10ML) SYRINGE FOR IV PUSH (FOR BLOOD PRESSURE SUPPORT)
PREFILLED_SYRINGE | INTRAVENOUS | Status: AC
  Filled 2011-04-01: qty 10

## 2011-04-01 MED ORDER — WITCH HAZEL-GLYCERIN EX PADS: 1.0000 "application " | MEDICATED_PAD | CUTANEOUS | Status: DC | PRN

## 2011-04-01 MED ORDER — OXYCODONE-ACETAMINOPHEN 5-325 MG PO TABS
1.0000 | ORAL_TABLET | ORAL | Status: DC | PRN
  Administered 2011-04-02 (×3): 1 via ORAL
  Administered 2011-04-03 – 2011-04-04 (×4): 2 via ORAL
  Filled 2011-04-01: qty 2
  Filled 2011-04-01 (×4): qty 1
  Filled 2011-04-01 (×2): qty 2
  Filled 2011-04-01: qty 1

## 2011-04-01 MED ORDER — IBUPROFEN 600 MG PO TABS
600.0000 mg | ORAL_TABLET | Freq: Four times a day (QID) | ORAL | Status: DC
  Administered 2011-04-01 – 2011-04-04 (×13): 600 mg via ORAL
  Filled 2011-04-01 (×13): qty 1

## 2011-04-01 MED ORDER — LANOLIN HYDROUS EX OINT: 1.0000 "application " | TOPICAL_OINTMENT | CUTANEOUS | Status: DC | PRN

## 2011-04-01 MED ORDER — MENTHOL 3 MG MT LOZG: 1.0000 | LOZENGE | OROMUCOSAL | Status: DC | PRN

## 2011-04-01 MED ORDER — ONDANSETRON HCL 4 MG/2ML IJ SOLN: 4.0000 mg | INTRAMUSCULAR | Status: DC | PRN

## 2011-04-01 MED ORDER — MEPERIDINE HCL 25 MG/ML IJ SOLN: 6.2500 mg | INTRAMUSCULAR | Status: DC | PRN

## 2011-04-01 MED ORDER — HEMOSTATIC AGENTS (NO CHARGE) OPTIME
TOPICAL | Status: DC | PRN
  Administered 2011-04-01: 1 via TOPICAL

## 2011-04-01 MED ORDER — ONDANSETRON HCL 4 MG/2ML IJ SOLN: 4.0000 mg | Freq: Once | INTRAMUSCULAR | Status: DC | PRN

## 2011-04-01 MED ORDER — FENTANYL CITRATE 0.05 MG/ML IJ SOLN
INTRAMUSCULAR | Status: AC
  Filled 2011-04-01: qty 2

## 2011-04-01 MED ORDER — LACTATED RINGERS IV SOLN
INTRAVENOUS | Status: DC
  Administered 2011-04-01: 15:00:00 via INTRAVENOUS

## 2011-04-01 MED ORDER — TETANUS-DIPHTH-ACELL PERTUSSIS 5-2.5-18.5 LF-MCG/0.5 IM SUSP
0.5000 mL | Freq: Once | INTRAMUSCULAR | Status: AC
  Administered 2011-04-02: 0.5 mL via INTRAMUSCULAR
  Filled 2011-04-01: qty 0.5

## 2011-04-01 MED ORDER — KETOROLAC TROMETHAMINE 60 MG/2ML IM SOLN
INTRAMUSCULAR | Status: AC
  Administered 2011-04-01: 60 mg via INTRAMUSCULAR
  Filled 2011-04-01: qty 2

## 2011-04-01 MED ORDER — SIMETHICONE 80 MG PO CHEW
80.0000 mg | CHEWABLE_TABLET | Freq: Three times a day (TID) | ORAL | Status: DC
  Administered 2011-04-02 – 2011-04-04 (×8): 80 mg via ORAL

## 2011-04-01 MED ORDER — OXYTOCIN 20 UNITS IN LACTATED RINGERS INFUSION - SIMPLE
125.0000 mL/h | INTRAVENOUS | Status: AC
  Filled 2011-04-01: qty 1000

## 2011-04-01 MED ORDER — KETOROLAC TROMETHAMINE 60 MG/2ML IM SOLN
60.0000 mg | Freq: Once | INTRAMUSCULAR | Status: AC | PRN
  Administered 2011-04-01: 60 mg via INTRAMUSCULAR

## 2011-04-01 MED ORDER — DIBUCAINE 1 % RE OINT: 1.0000 "application " | TOPICAL_OINTMENT | RECTAL | Status: DC | PRN

## 2011-04-01 MED ORDER — HYDROMORPHONE HCL 1 MG/ML IJ SOLN: 0.2500 mg | INTRAMUSCULAR | Status: DC | PRN

## 2011-04-01 MED ORDER — MORPHINE SULFATE 0.5 MG/ML IJ SOLN
INTRAMUSCULAR | Status: AC
  Filled 2011-04-01: qty 10

## 2011-04-01 MED ORDER — SENNOSIDES-DOCUSATE SODIUM 8.6-50 MG PO TABS
2.0000 | ORAL_TABLET | Freq: Every day | ORAL | Status: DC
  Administered 2011-04-01 – 2011-04-03 (×3): 2 via ORAL

## 2011-04-01 MED ORDER — ONDANSETRON HCL 4 MG PO TABS: 4.0000 mg | ORAL_TABLET | ORAL | Status: DC | PRN

## 2011-04-01 MED ORDER — PRENATAL PLUS 27-1 MG PO TABS
1.0000 | ORAL_TABLET | Freq: Every day | ORAL | Status: DC
  Administered 2011-04-03: 1 via ORAL

## 2011-04-01 SURGICAL SUPPLY — 29 items
CHLORAPREP W/TINT 26ML (MISCELLANEOUS) ×2 IMPLANT
CLIP FILSHIE TUBAL LIGA STRL (Clip) ×2 IMPLANT
CLOTH BEACON ORANGE TIMEOUT ST (SAFETY) ×2 IMPLANT
DRESSING TELFA 8X3 (GAUZE/BANDAGES/DRESSINGS) ×2 IMPLANT
ELECT REM PT RETURN 9FT ADLT (ELECTROSURGICAL) ×2
ELECTRODE REM PT RTRN 9FT ADLT (ELECTROSURGICAL) ×1 IMPLANT
EXTRACTOR VACUUM KIWI (MISCELLANEOUS) IMPLANT
GAUZE SPONGE 4X4 12PLY STRL LF (GAUZE/BANDAGES/DRESSINGS) ×2 IMPLANT
GLOVE BIO SURGEON STRL SZ 6.5 (GLOVE) ×2 IMPLANT
GLOVE BIOGEL PI IND STRL 7.0 (GLOVE) ×2 IMPLANT
GLOVE BIOGEL PI INDICATOR 7.0 (GLOVE) ×2
GOWN PREVENTION PLUS LG XLONG (DISPOSABLE) ×6 IMPLANT
KIT ABG SYR 3ML LUER SLIP (SYRINGE) IMPLANT
NEEDLE HYPO 25X5/8 SAFETYGLIDE (NEEDLE) IMPLANT
NS IRRIG 1000ML POUR BTL (IV SOLUTION) ×2 IMPLANT
PACK C SECTION WH (CUSTOM PROCEDURE TRAY) ×2 IMPLANT
PAD ABD 7.5X8 STRL (GAUZE/BANDAGES/DRESSINGS) ×2 IMPLANT
SLEEVE SCD COMPRESS KNEE LRG (MISCELLANEOUS) IMPLANT
SLEEVE SCD COMPRESS KNEE MED (MISCELLANEOUS) ×2 IMPLANT
SPONGE LAP 18X18 X RAY DECT (DISPOSABLE) ×6 IMPLANT
SUT PLAIN 2 0 XLH (SUTURE) ×2 IMPLANT
SUT VIC AB 0 CT1 36 (SUTURE) ×12 IMPLANT
SUT VIC AB 2-0 CT1 27 (SUTURE) ×1
SUT VIC AB 2-0 CT1 TAPERPNT 27 (SUTURE) ×1 IMPLANT
SUT VIC AB 4-0 PS2 27 (SUTURE) ×2 IMPLANT
TAPE CLOTH SURG 4X10 WHT LF (GAUZE/BANDAGES/DRESSINGS) ×2 IMPLANT
TOWEL OR 17X24 6PK STRL BLUE (TOWEL DISPOSABLE) ×4 IMPLANT
TRAY FOLEY CATH 14FR (SET/KITS/TRAYS/PACK) IMPLANT
WATER STERILE IRR 1000ML POUR (IV SOLUTION) ×2 IMPLANT

## 2011-04-01 NOTE — Op Note (Signed)
Cesarean Section and Bilateral Tubal Ligation Procedure Note  Indications: Previous c-section x 3, PPPROM at [redacted] wks EGA of 30-yo [id number], undesired fertility  Pre-operative Diagnosis: 33 week 0 day pregnancy.  Post-operative Diagnosis: same  Surgeon: Dr Scheryl Darter, MD  Assistants: Dr Lucina Mellow, DO  Anesthesia: Spinal anesthesia  ASA Class: 2   Procedure Details   The patient was seen in the Holding Room. The risks, benefits, complications, treatment options, and expected outcomes were discussed with the patient.  The patient concurred with the proposed plan, giving informed consent.  The site of surgery properly noted/marked. The patient was taken to Operating Room # 1, identified as Cheryl Mcfarland and the procedure verified as C-Section Delivery with bilateral tubal ligation. A Time Out was held and the above information confirmed.  After induction of anesthesia, the patient was draped and prepped in the usual sterile manner. A Pfannenstiel incision was made and carried down through the subcutaneous tissue to the fascia. Fascial incision was made and extended transversely. The fascia was separated from the underlying rectus tissue superiorly and inferiorly. There was much scar tissue. The peritoneum was identified and entered. Peritoneal incision was extended longitudinally. Many adhesions were noted of the peritoneum to the lower uterine segment. Adhesions were taken down with the bovie, and the uterus was entered in the lower uterine segment. This incision was extended laterally. Delivered from cephalic presentation was a 2050 gram Female with Apgar scores of 8 at one minute and 8 at five minutes. After the umbilical cord was clamped and cut cord blood and cord gas were obtained for evaluation. The placenta was removed intact and appeared normal. The uterine outline, tubes and ovaries appeared normal. The uterine incision was closed with running locked sutures of Vicryl.  A second  layer was placed and hemostasis was observed. Attention was then turned to the patient's Left tube and ovary. The tube was grasped with babcock clamps and a Filshie Clip was placed. In a similar fashion, the patient's Right tube was identifed, grasped and a Filshie Clip was placed. Both tubes were returned to the abdomen. A piece of Seprafilm was laid across the uterine incision. The fascia was then reapproximated with running sutures of Vicryl. The skin was reapproximated with Vicryl.  Instrument, sponge, and needle counts were correct prior the abdominal closure and at the conclusion of the case.   Findings: Adhesions as noted, viable infant female, normal tubes and ovaries.  Estimated Blood Loss:  800 mL                Specimens: Placenta to pathology         Complications:  None; patient tolerated the procedure well.         Disposition: PACU - hemodynamically stable.         Condition: stable

## 2011-04-01 NOTE — Progress Notes (Signed)
  Pt still has bleeding with increase over the last hr. Good FM, no c/o pain FHR 140-150 no decels  Abd not tender, soft, gravid  Moderate blood on pad IUP 33 weeks, suspect possible abruption, prolonged ROM  Plan Offered to proceed with C/S and BTL, procedure and risks discussed and questions answered. Consent on chart.

## 2011-04-01 NOTE — Transfer of Care (Signed)
Immediate Anesthesia Transfer of Care Note  Patient: Cheryl Mcfarland  Procedure(s) Performed:  CESAREAN SECTION - Repeat Cesarean Section With Bilateral Tubal Ligation Filshie Clips  Patient Location: PACU  Anesthesia Type: Spinal  Level of Consciousness: awake, alert  and oriented  Airway & Oxygen Therapy: Patient Spontanous Breathing  Post-op Assessment: Report given to PACU RN and Post -op Vital signs reviewed and stable  Post vital signs: Reviewed and stable  Complications: No apparent anesthesia complications

## 2011-04-01 NOTE — Anesthesia Postprocedure Evaluation (Signed)
Anesthesia Post Note  Patient: Cheryl Mcfarland  Procedure(s) Performed:  CESAREAN SECTION - Repeat Cesarean Section With Bilateral Tubal Ligation Filshie Clips  Anesthesia type: Spinal  Patient location: PACU  Post pain: Pain level controlled  Post assessment: Post-op Vital signs reviewed  Last Vitals:  Filed Vitals:   04/01/11 1316  BP: 137/88  Pulse: 91  Temp:   Resp: 20    Post vital signs: Reviewed  Level of consciousness: awake  Complications: No apparent anesthesia complications

## 2011-04-01 NOTE — Progress Notes (Signed)
  FACULTY PRACTICE ANTEPARTUM(COMPREHENSIVE) NOTE  Cheryl Mcfarland is a 30 y.o. G5P4003 at [redacted]w[redacted]d admitted for PPROM Length of Stay:  17  Days  Subjective: leakeage of bloody fluid is less that late last night.   Patient reports the fetal movement as active. Patient reports uterine contraction  activity as none. Patient reports  vaginal bleeding as blood mixed with amniotic fluid, less over past  hours.. Patient describes fluid per vagina as Other blood tinged.  Vitals:  Blood pressure 128/82, pulse 103, temperature 97.7 F (36.5 C), temperature source Oral, resp. rate 20, height 5\' 2"  (1.575 m), weight 114.715 kg (252 lb 14.4 oz), last menstrual period 08/04/2010. Physical Examination:  General appearance - alert, well appearing, and in no distress Abdomen - soft, nontender, nondistended, no masses or organomegaly Extremities - peripheral pulses normal, no pedal edema, no clubbing or cyanosis, Homan's sign negative bilaterally  Fetal Monitoring:  Baseline: 130 bpm, accels to 150, no decels, nod variablitiy  Labs:  Recent Results (from the past 24 hour(s))  CBC   Collection Time   03/31/11 11:10 PM      Component Value Range   WBC 8.6  4.0 - 10.5 (K/uL)   RBC 4.04  3.87 - 5.11 (MIL/uL)   Hemoglobin 9.7 (*) 12.0 - 15.0 (g/dL)   HCT 16.1 (*) 09.6 - 46.0 (%)   MCV 74.0 (*) 78.0 - 100.0 (fL)   MCH 24.0 (*) 26.0 - 34.0 (pg)   MCHC 32.4  30.0 - 36.0 (g/dL)   RDW 04.5 (*) 40.9 - 15.5 (%)   Platelets 286  150 - 400 (K/uL)  DIFFERENTIAL   Collection Time   03/31/11 11:10 PM      Component Value Range   Neutrophils Relative 70  43 - 77 (%)   Neutro Abs 6.0  1.7 - 7.7 (K/uL)   Lymphocytes Relative 22  12 - 46 (%)   Lymphs Abs 1.9  0.7 - 4.0 (K/uL)   Monocytes Relative 8  3 - 12 (%)   Monocytes Absolute 0.7  0.1 - 1.0 (K/uL)   Eosinophils Relative 0  0 - 5 (%)   Eosinophils Absolute 0.0  0.0 - 0.7 (K/uL)   Basophils Relative 0  0 - 1 (%)   Basophils Absolute 0.0  0.0 - 0.1 (K/uL)       Medications:  Scheduled    . docusate sodium  100 mg Oral Daily  . famotidine  20 mg Oral BID  . prenatal vitamin w/FE, FA  1 tablet Oral Daily   I have reviewed the patient's current medications.  ASSESSMENT: Patient Active Problem List  Diagnoses  . TOBACCO USER  . Nausea/vomiting in pregnancy  . H/O: cesarean section  . URI (upper respiratory infection)  . Preterm Premature Rupture of Membranes (PPROM)    PLAN: 30 yo G5P4003 at 69 weeks with PPROM.  New onset bloody fluid that has decreased over the past 7 hours.    1.  Pt to get up and walk to bathroom and see if more bleeding occurs this morning. 2.  Anemic--obtain labs 3.  Maintain IV and continuous monitoring.  Cheryl Mcfarland H. 04/01/2011,6:55 AM

## 2011-04-01 NOTE — Progress Notes (Signed)
CNM notified of decrease in bleeding.

## 2011-04-02 LAB — CBC
HCT: 24.2 % — ABNORMAL LOW (ref 36.0–46.0)
Hemoglobin: 7.9 g/dL — ABNORMAL LOW (ref 12.0–15.0)
MCV: 74.5 fL — ABNORMAL LOW (ref 78.0–100.0)
Platelets: 240 10*3/uL (ref 150–400)
RBC: 3.25 MIL/uL — ABNORMAL LOW (ref 3.87–5.11)
WBC: 10.7 10*3/uL — ABNORMAL HIGH (ref 4.0–10.5)

## 2011-04-02 NOTE — Anesthesia Postprocedure Evaluation (Signed)
  Anesthesia Post-op Note  Patient: Cheryl Mcfarland  Procedure(s) Performed:  CESAREAN SECTION - Repeat Cesarean Section With Bilateral Tubal Ligation Filshie Clips  Patient Location: PACU and Mother/Baby  Anesthesia Type: Epidural  Level of Consciousness: awake, alert  and oriented  Airway and Oxygen Therapy: Patient Spontanous Breathing and Patient connected to nasal cannula oxygen  Post-op Pain: none  Post-op Assessment: Post-op Vital signs reviewed and Patient's Cardiovascular Status Stable  Post-op Vital Signs: Reviewed and stable  Complications: No apparent anesthesia complications

## 2011-04-02 NOTE — Progress Notes (Signed)
Post Partum Day 1  Subjective: no complaints, up ad lib, voiding, tolerating PO, + flatus and no BM yet, pain well controlled.  Objective: Blood pressure 135/90, pulse 86, temperature 98.4 F (36.9 C), temperature source Oral, resp. rate 19, height 5\' 2"  (1.575 m), weight 252 lb 14.4 oz (114.715 kg), last menstrual period 08/04/2010, SpO2 94.00%, unknown if currently breastfeeding.  Physical Exam:  General: alert, cooperative and no distress Heart: RRR, 2/6 SEM Lungs: CTAB Abdomen: active BS Lochia: appropriate Uterine Fundus: firm, tender with palpation Incision: healing well, no significant drainage, no significant erythema DVT Evaluation: No evidence of DVT seen on physical exam.    Basename 04/02/11 0530 03/31/11 2310  HGB 7.9* 9.7*  HCT 24.2* 29.9*    Assessment/Plan: Contraception s/p BTL yesterday  Will likely be in house for 72 hrs post C/S.  Patient aware.  Encourage ambulation.  Awaiting bowel function.  Continue with routine post-op care.   LOS: 18 days   DE LA CRUZ,Tasean Mancha 04/02/2011, 8:50 AM

## 2011-04-03 ENCOUNTER — Encounter: Payer: Medicaid Other | Admitting: Family Medicine

## 2011-04-03 NOTE — Progress Notes (Signed)
UR Chart review completed.  

## 2011-04-03 NOTE — Progress Notes (Signed)
PSYCHOSOCIAL ASSESSMENT ~ MATERNAL/CHILD Name: Leda Roys                                                                                              Age: 32 days   Referral Date: 04/03/11   Reason/Source: NICU Support/NICU  I. FAMILY/HOME ENVIRONMENT A. Child's Legal Guardian _x__Parent(s) ___Grandparent ___Foster parent ___DSS_________________ Name: Cindy Hazy Ziff                             DOB: //                     Age: 30  Address: [address] [place], [place], [place] [address]  Name: Marvis Repress                                          DOB: //                     Age:   Address: same  B. Other Household Members/Support Persons   MOB has 3 children at home, ages 6, 44 and 48.  C. Other Support: good support system.   II. PSYCHOSOCIAL DATA A. Information Source                                                                                                  _x_Patient Interview  _x_Family Interview           _x_Other: chart  B. Event organiser __Employment: _x_Medicaid    [place]                __Private Insurance:                   __Self Pay  __[employer]   _x_WIC __[employer]     __Public Housing     __Section 8    __Maternity Care Coordination/Child Service Coordination/Early Intervention  _x_School: [employer]                                                        Grade:  __Other:   Salena Saner Cultural and Environment Information Cultural Issues Impacting Care: none known  III. STRENGTHS _x__Supportive family/friends _x__Adequate Resources _x__Compliance with medical plan ___Home prepared for Child (including basic supplies) _x__Understanding of illness      _x__Other: Pecola Leisure will go to [facility] for pediatric  follow up. IV. RISK FACTORS AND CURRENT PROBLEMS         __x__No Problems Noted                                                                                                                                                                                                                                                Pt              Family         Substance Abuse                                                                  ___              ___             Mental Illness                                                                        ___              ___  Family/Relationship Issues                                      ___               ___             Abuse/Neglect/Domestic Violence                                         ___         ___  Financial Resources  ___              ___             Transportation                                                                        ___               ___  DSS Involvement                                                                   ___              ___  Adjustment to Illness                                                               ___              ___  Knowledge/Cognitive Deficit                                                   ___              ___             Compliance with Treatment                                                 ___              ___  Basic Needs (food, housing, etc.)                                          ___              ___             Housing Concerns                                       ___              ___ Other_____________________________________________________________            V. SOCIAL WORK ASSESSMENT SW met with MOB in her third floor room to introduce myself, complete assessment and evaluate how she is coping with baby's premature birth and admission to NICU.  MOB was friendly, but didn't really seem like she wanted to talk with SW.  Assessment was fairly brief.  She states that she has a great support system and that FOB is involved and supportive.  She states she has three children at home and a fourth child who died 12 years ago.  She said it was a "long story" and did not  elaborate.  She states she does not have baby supplies yet, but will be in the process of getting items together.  She is not working and FOB is in school studying to be in the [employer] so SW asked MOB to let SW know if she has needs getting items together.  SW explained SW support services offered in NICU and MOB said that she is just trying to figure out when baby will come home.  She said that NICU staff is telling her that it will be around baby's due date.  She said she did not think her baby will be here until November.  SW explained that while premature babies often don't need to stay in the NICU until their due date, keeping this date in mind will help her not be let down if baby doesn't go home as soon as she thinks she might.  If she thinks the baby will go home in November and she goes home sooner, it will be a pleasant surprise.  MOB was understanding.  MOB states no questions or needs at this time.  She states she has no transportation issues at discharge.  VI. SOCIAL WORK PLAN  ___No Further Intervention Required/No Barriers to Discharge   _x__Psychosocial Support and Ongoing Assessment of Needs   ___Patient/Family Education:   ___Child Protective Services Report   County___________ Date___/____/____   ___Information/Referral to [employer]_________________________   ___Other:

## 2011-04-03 NOTE — Progress Notes (Signed)
Post Partum Day #3   Subjective: up ad lib, voiding, tolerating PO, + flatus and no bowel movement yet.  Pain controlled on Percocet.  Objective: Blood pressure 155/99, pulse 99, temperature 98.2 F (36.8 C), temperature source Oral, resp. rate 18, height 5\' 2"  (1.575 m), weight 252 lb 14.4 oz (114.715 kg), last menstrual period 08/04/2010, SpO2 97.00%, unknown if currently breastfeeding.  Physical Exam:  General: alert, cooperative and moderately obese Lochia: appropriate Uterine Fundus: firm Incision: healing well, no significant drainage, no significant erythema DVT Evaluation: No evidence of DVT seen on physical exam.   Basename 04/02/11 0530 03/31/11 2310  HGB 7.9* 9.7*  HCT 24.2* 29.9*    Assessment/Plan: Contraception s/p BTL  Likely home tomorrow, baby in NICU.  Continue routine post-op care.   LOS: 19 days   DE LA CRUZ,IVY 04/03/2011, 8:12 AM

## 2011-04-04 LAB — CBC
HCT: 28.2 % — ABNORMAL LOW (ref 36.0–46.0)
Hemoglobin: 9 g/dL — ABNORMAL LOW (ref 12.0–15.0)
MCH: 24.1 pg — ABNORMAL LOW (ref 26.0–34.0)
MCHC: 31.9 g/dL (ref 30.0–36.0)
RBC: 3.73 MIL/uL — ABNORMAL LOW (ref 3.87–5.11)

## 2011-04-04 LAB — COMPREHENSIVE METABOLIC PANEL
ALT: 26 U/L (ref 0–35)
Alkaline Phosphatase: 100 U/L (ref 39–117)
BUN: 6 mg/dL (ref 6–23)
CO2: 27 mEq/L (ref 19–32)
Calcium: 9.4 mg/dL (ref 8.4–10.5)
GFR calc Af Amer: >60 mL/min (ref >60)
GFR calc non Af Amer: >60 mL/min (ref >60)
Glucose, Bld: 79 mg/dL (ref 70–99)
Potassium: 3.2 mEq/L — ABNORMAL LOW (ref 3.5–5.1)
Sodium: 136 mEq/L (ref 135–145)
Total Protein: 6.9 g/dL (ref 6.0–8.3)

## 2011-04-04 LAB — TYPE AND SCREEN

## 2011-04-04 LAB — PROTEIN / CREATININE RATIO, URINE: Creatinine, Urine: 37.5 mg/dL

## 2011-04-04 MED ORDER — DSS 100 MG PO CAPS: 100.0000 mg | ORAL_CAPSULE | Freq: Every day | ORAL | Status: AC

## 2011-04-04 MED ORDER — IBUPROFEN 600 MG PO TABS: 600.0000 mg | ORAL_TABLET | Freq: Four times a day (QID) | ORAL | Status: AC | PRN

## 2011-04-04 MED ORDER — ACYCLOVIR 5 % EX CREA: 1.0000 "application " | TOPICAL_CREAM | CUTANEOUS | Status: AC

## 2011-04-04 MED ORDER — FERROUS SULFATE 325 (65 FE) MG PO TABS: 325.0000 mg | ORAL_TABLET | Freq: Three times a day (TID) | ORAL | Status: DC

## 2011-04-04 MED ORDER — OXYCODONE-ACETAMINOPHEN 5-325 MG PO TABS: 1.0000 | ORAL_TABLET | Freq: Four times a day (QID) | ORAL | Status: AC | PRN

## 2011-04-04 MED ORDER — HYDROCHLOROTHIAZIDE 25 MG PO TABS
25.0000 mg | ORAL_TABLET | Freq: Once | ORAL | Status: AC
  Administered 2011-04-04: 25 mg via ORAL
  Filled 2011-04-04: qty 1

## 2011-04-04 MED ORDER — CALCIUM CARBONATE ANTACID 500 MG PO CHEW: 2.0000 | CHEWABLE_TABLET | ORAL | Status: AC | PRN

## 2011-04-04 MED ORDER — ACYCLOVIR 5 % EX CREA
TOPICAL_CREAM | CUTANEOUS | Status: DC
  Administered 2011-04-04: 11:00:00 via TOPICAL
  Administered 2011-04-04: 1 via TOPICAL
  Administered 2011-04-04: 19:00:00 via TOPICAL
  Filled 2011-04-04: qty 5

## 2011-04-04 NOTE — Progress Notes (Signed)
Post Partum Day #3  Subjective: voiding, tolerating PO, + flatus and still no BM.  Patient complains of lip swelling and lip blisters this morning.  Objective: Blood pressure 126/87, pulse 94, temperature 98.3 F (36.8 C), temperature source Oral, resp. rate 18, height 5\' 2"  (1.575 m), weight 252 lb 14.4 oz (114.715 kg), last menstrual period 08/04/2010, SpO2 98.00%, unknown if currently breastfeeding.  Physical Exam:  General: alert, cooperative, no distress and moderately obese Lochia: appropriate Uterine Fundus: firm Incision: healing well, no significant drainage, no significant erythema DVT Evaluation: No cords or calf tenderness. Calf/Ankle edema is present. 2-3+ pitting lower extremity edema bilaterally   Basename 04/02/11 0530  HGB 7.9*  HCT 24.2*    Assessment/Plan: Plan for discharge tomorrow or today pending labs and further work- up.  Will order CBC, CMET, urine pr/cr ratio, and 24 hr urine total protein.  Will give one dose HCTZ 25 mg now.  Patient will need to follow up at [facility] early next week for blood pressure follow-up.   LOS: 20 days   DE LA CRUZ,IVY 04/04/2011, 9:18 AM

## 2011-04-04 NOTE — Discharge Summary (Signed)
Obstetric Discharge Summary Reason for Admission: preterm premature rupture of membranes Prenatal Procedures: NST, CST and ultrasound Intrapartum Procedures: emergency cesarean: low cervical, transverse  Postpartum Procedures: P.P. tubal ligation Complications-Operative and Postpartum: none Hemoglobin  Date Value Range Status  04/04/2011 9.0* 12.0-15.0 (g/dL) Final     HCT  Date Value Range Status  04/04/2011 28.2* 36.0-46.0 (%) Final    Discharge Diagnoses: Antepartum bleeding and Premature labor  Discharge Information: Date: 04/04/2011 Activity: pelvic rest x 6 weeks Diet: routine Medications: PNV, Ibuprophen, Colace, Iron and Percocet Condition: stable Instructions: refer to practice specific booklet Discharge to: home Follow-up Information    Follow up with Delbert Harness, MD on 04/07/2011. (at 8:45 am for blood pressure follow up)    Contact information:   [facility] [phone]         Newborn Data: Live born female  Birth Weight: 4 lb 8.3 oz (2050 g) APGAR: 8, 8  Home with family.  DE LA Mcfarland,Cheryl Murdoch 04/04/2011, 5:25 PM

## 2011-04-07 ENCOUNTER — Ambulatory Visit: Payer: Medicaid Other | Admitting: Family Medicine

## 2011-04-10 ENCOUNTER — Encounter (HOSPITAL_COMMUNITY): Payer: Self-pay | Admitting: Obstetrics & Gynecology

## 2011-04-10 NOTE — Anesthesia Preprocedure Evaluation (Signed)
Anesthesia Evaluation  Name, MR# and DOB Patient awake  General Assessment Comment  Reviewed: Allergy & Precautions, H&P , NPO status , Patient's Chart, lab work & pertinent test results  Airway Mallampati: II TM Distance: >3 FB Neck ROM: full    Dental No notable dental hx.    Pulmonary  clear to auscultation  pulmonary exam normalPulmonary Exam Normal breath sounds clear to auscultation none    Cardiovascular     Neuro/Psych Negative Neurological ROS  Negative Psych ROS  GI/Hepatic/Renal negative GI ROS  negative Liver ROS  negative Renal ROS        Endo/Other    Abdominal (+) obese,   Musculoskeletal   Hematology negative hematology ROS (+)   Peds  Reproductive/Obstetrics (+) Pregnancy    Anesthesia Other Findings             Anesthesia Physical Anesthesia Plan  ASA: III  Anesthesia Plan: Spinal   Post-op Pain Management:    Induction:   Airway Management Planned:   Additional Equipment:   Intra-op Plan:   Post-operative Plan:   Informed Consent: I have reviewed the patients History and Physical, chart, labs and discussed the procedure including the risks, benefits and alternatives for the proposed anesthesia with the patient or authorized representative who has indicated his/her understanding and acceptance.     Plan Discussed with:   Anesthesia Plan Comments:         Anesthesia Quick Evaluation

## 2011-05-13 ENCOUNTER — Encounter (HOSPITAL_COMMUNITY)
Admission: AD | Disposition: A | Discharge status: Home / Self Care | Payer: Self-pay | Source: Ambulatory Visit | Attending: Obstetrics and Gynecology

## 2011-05-13 SURGERY — Surgical Case
Anesthesia: Regional | Site: Spine Cervical | Laterality: Bilateral

## 2011-05-20 ENCOUNTER — Ambulatory Visit: Payer: Medicaid Other | Admitting: Family Medicine

## 2011-06-12 ENCOUNTER — Ambulatory Visit (INDEPENDENT_AMBULATORY_CARE_PROVIDER_SITE_OTHER): Payer: Medicaid Other | Admitting: Family Medicine

## 2011-06-12 ENCOUNTER — Encounter: Payer: Self-pay | Admitting: Family Medicine

## 2011-06-12 DIAGNOSIS — Z0000 Encounter for general adult medical examination without abnormal findings: Secondary | ICD-10-CM | POA: Insufficient documentation

## 2011-06-12 MED ORDER — IBUPROFEN 600 MG PO TABS: 600.0000 mg | ORAL_TABLET | Freq: Four times a day (QID) | ORAL | Status: AC | PRN

## 2011-06-12 NOTE — Progress Notes (Signed)
  Subjective:     Cheryl Mcfarland is a 30 y.o. female who presents for a postpartum visit. She is 2 months postpartum following a low cervical transverse Cesarean section. I have fully reviewed the prenatal and intrapartum course. The delivery pre-term for PPROM.  Outcome: repeat cesarean section, low transverse incision. Anesthesia: IV sedation. Postpartum course has been normal, no concerns. Baby's course has been progressing well. Baby is feeding by bottle - [employer] Good Start. Bleeding no bleeding after surgery.  She has had two regular periods since surgery.  Bowel function is normal. Bladder function is normal. Patient is sexually active. Contraception method is tubal ligation. Postpartum depression screening: negative, patient has good support network at home.  The following portions of the patient's history were reviewed and updated as appropriate: allergies, current medications, past medical history and problem list.  Review of Systems Pertinent items are noted in HPI.   Objective:    BP 131/91  Pulse 84  Wt 233 lb 14.4 oz (106.096 kg)  General:  alert, cooperative and no distress   Breasts:  deferred  Lungs: clear to auscultation bilaterally  Heart:  regular rate and rhythm, S1, S2 normal, no murmur, click, rub or gallop  Abdomen: soft, non-tender; bowel sounds normal; no masses,  no organomegaly; incision scar healing well without open wound or bleeding        Assessment:    Normal postpartum exam. Pap smear not done at today's visit.   Plan:    1. Contraception: tubal ligation 2. No evidence of PP depression.  Continue to exercise. 3. Follow up in: 1 year or as needed.

## 2011-06-12 NOTE — Patient Instructions (Signed)
Postpartum Tubal Ligation A postpartum tubal ligation (PPTL) is when the fallopian tubes are tied after a pregnancy. The fallopian tubes carry the eggs from the ovary to the uterus. A PPTL procedure is done to permanently prevent pregnancy (sterilization). Although this procedure may be reversed, it should be considered permanent and irreversible. This means they cannot be repaired. You should consider that you will never have children again.  This decision should be thought over carefully and discussed with your partner. Discuss it while you are pregnant in order to make a more sound and intelligent decision, rather than waiting until the baby is born. It may be good to wait until a day or two after the birth to make sure everything is all right with the baby. You must be absolutely sure you do not want another pregnancy. PPTL should be delayed if any medical problems develop during labor and delivery with the mother or the baby. RISKS AND COMPLICATIONS  Infection. A germ starts growing in the wound. This can usually be treated with antibiotics.   Fever.   Bleeding is a complication of almost all surgeries. However, it is not common following this surgery.   Pregnancy. This may happen when the body repairs itself and the tubes or one of the tubes is again able to transport an egg to the uterus. This may also occur as the result of a surgical failure. All surgeries, regardless of how perfectly they are done, are not always successful with perfect results.   Tubal pregnancy. A tubal pregnancy may occur following a tubal ligation when the tube has repaired enough to transport an egg. Because the tube has been damaged by surgery, it is more likely that the fertilized egg can implant in the tube. A tubal pregnancy can be life-threatening.   Injury to surrounding organs and blood vessels.   There is an increase incidence of hysterectomy later in life. The reason is unknown.   Regret is a late  complication. You may wish to carry another pregnancy again. Chances of this can be lessened by careful decision making before the procedure. All possibilities should be thought of. This includes the things you do not like to think about such as divorce, loss of your spouse, death or loss of your children.  Women who regret having a tubal ligation and wish to become pregnant again have a couple of choices that include:  Reversing the tubal ligation, untying and connecting the tubes again. However:   There is a higher risk of a tubal pregnancy.   It is not always successful.   In vitro fertilization. However, it is:   Very complicated and demanding on the patient.   Not always successful.  PROCEDURE  The PPTL is a surgical procedure. This procedure can be safely performed right after delivery or the day after delivery.   If it is done following a vaginal delivery, it can be done through a small cut (incision) just beneath the belly button. A medicine will be used that numbs the area or puts you to sleep (anesthetic).   If a caesarean section is performed, the decision on whether to have a tubal ligation is usually made before the surgery. This is so the tubal ligation may be done at the same time, after delivery of the baby.   The fallopian tubes are tied off with 2 stitches (sutures).   A small piece of the tube is removed and then looked at under a microscope to make sure it is   the tube.  Different procedures are available for performing the surgery on the tubes. Your surgeon will discuss the pros and cons for PPTL. A PPTL does not require a longer hospital stay. Document Released: 06/30/2005 Document Revised: 03/12/2011 Document Reviewed: 10/18/2008 [facility] Patient Information 2012 [place], [place].

## 2011-07-02 ENCOUNTER — Telehealth: Payer: Self-pay | Admitting: Family Medicine

## 2011-07-02 NOTE — Telephone Encounter (Signed)
Never rec'd the ibuprofen from last visit.  [facility]

## 2011-07-02 NOTE — Telephone Encounter (Signed)
Called pharmacy and [facility] [id number] RX. Pt notified, also as pt states she never picked up the 1st RX

## 2011-08-26 ENCOUNTER — Ambulatory Visit (INDEPENDENT_AMBULATORY_CARE_PROVIDER_SITE_OTHER): Payer: Self-pay | Admitting: *Deleted

## 2011-08-26 DIAGNOSIS — Z111 Encounter for screening for respiratory tuberculosis: Secondary | ICD-10-CM

## 2011-08-28 ENCOUNTER — Ambulatory Visit: Payer: Self-pay

## 2011-09-05 ENCOUNTER — Ambulatory Visit (INDEPENDENT_AMBULATORY_CARE_PROVIDER_SITE_OTHER): Payer: Self-pay | Admitting: *Deleted

## 2011-09-05 DIAGNOSIS — Z111 Encounter for screening for respiratory tuberculosis: Secondary | ICD-10-CM

## 2011-09-05 NOTE — Progress Notes (Signed)
Patient in today to read PPD she states . However she had PPD placed 02/12 and did not return to have read at that time. PPD reapplied today and she will return 02/25 to read.

## 2011-09-09 ENCOUNTER — Ambulatory Visit (INDEPENDENT_AMBULATORY_CARE_PROVIDER_SITE_OTHER): Payer: Self-pay | Admitting: *Deleted

## 2011-09-09 DIAGNOSIS — Z111 Encounter for screening for respiratory tuberculosis: Secondary | ICD-10-CM

## 2011-09-09 NOTE — Progress Notes (Signed)
Patient did not return as instructed to read PPD on yesterday. PPD today is negative. Called health Dept and spoke with TB control nurse. She advises unable to read if past the 72 hour period. .  She advises that PPD may be repeated again. Consulted with Dr. Jennette Kettle and she advises Ok to repeat today .    PPD repeated and patient again advised to return  after 10:40 AM on 02/28 or before 10:40 on 03/01.  States she needs this for her job.

## 2011-09-11 ENCOUNTER — Ambulatory Visit (INDEPENDENT_AMBULATORY_CARE_PROVIDER_SITE_OTHER): Payer: Self-pay | Admitting: *Deleted

## 2011-09-11 DIAGNOSIS — IMO0001 Reserved for inherently not codable concepts without codable children: Secondary | ICD-10-CM

## 2011-09-11 DIAGNOSIS — Z111 Encounter for screening for respiratory tuberculosis: Secondary | ICD-10-CM

## 2011-09-11 LAB — TB SKIN TEST: TB Skin Test: NEGATIVE mm

## 2012-02-04 ENCOUNTER — Encounter (HOSPITAL_COMMUNITY): Payer: Self-pay | Admitting: Emergency Medicine

## 2012-02-04 ENCOUNTER — Emergency Department (INDEPENDENT_AMBULATORY_CARE_PROVIDER_SITE_OTHER)
Admission: EM | Admit: 2012-02-04 | Discharge: 2012-02-04 | Disposition: A | Discharge status: Home / Self Care | Payer: Self-pay | Source: Home / Self Care

## 2012-02-04 DIAGNOSIS — J029 Acute pharyngitis, unspecified: Secondary | ICD-10-CM

## 2012-02-04 DIAGNOSIS — H9209 Otalgia, unspecified ear: Secondary | ICD-10-CM

## 2012-02-04 DIAGNOSIS — J020 Streptococcal pharyngitis: Secondary | ICD-10-CM

## 2012-02-04 HISTORY — DX: Nicotine dependence, unspecified, uncomplicated: F17.200

## 2012-02-04 MED ORDER — ACETAMINOPHEN 325 MG PO TABS
650.0000 mg | ORAL_TABLET | Freq: Once | ORAL | Status: AC
  Administered 2012-02-04: 650 mg via ORAL

## 2012-02-04 MED ORDER — ACETAMINOPHEN 325 MG PO TABS
ORAL_TABLET | ORAL | Status: AC
  Filled 2012-02-04: qty 2

## 2012-02-04 MED ORDER — PENICILLIN G BENZATHINE 1200000 UNIT/2ML IM SUSP
1.2000 10*6.[IU] | Freq: Once | INTRAMUSCULAR | Status: AC
  Administered 2012-02-04: 1.2 10*6.[IU] via INTRAMUSCULAR

## 2012-02-04 MED ORDER — PENICILLIN G BENZATHINE 1200000 UNIT/2ML IM SUSP
INTRAMUSCULAR | Status: AC
  Filled 2012-02-04: qty 2

## 2012-02-04 NOTE — ED Provider Notes (Signed)
History     CSN: [id number]  Arrival date & time 02/04/12  1821   None     Chief Complaint  Patient presents with  . Otalgia    (Consider location/radiation/quality/duration/timing/severity/associated sxs/prior treatment) The history is provided by the patient.  Nickia is a 31 y.o. female who complains of onset of nasal congestion, headache, sore throat and earache for 1 day.  + sore throat No cough, non productive No pleuritic pain No wheezing + nasal congestion No post-nasal drainage + sinus pain/pressure No laryngitis No chest congestion No itchy/red eyes Bilateal earache No hemoptysis No SOB + chills/sweats No fever + nausea No vomiting No abdominal pain No diarrhea No skin rashes No fatigue + myalgias + headache  No ill contacts Ibuprofen for symptoms with minimal relief  Past Medical History  Diagnosis Date  . Sickle cell anemia     trait  . Trauma     1997-1999  . Sickle cell trait   . Asthma     more as a child  . Urinary tract infection   . TOBACCO USER 07/16/2009    Past Surgical History  Procedure Date  . Appendectomy   . Cesarean section     x4  . Cesarean section 04/01/2011    Procedure: CESAREAN SECTION;  Surgeon: Scheryl Darter, MD;  Location: [facility];  Service: Gynecology;  Laterality: Bilateral;  Repeat Cesarean Section With Bilateral Tubal Ligation Filshie Clips    Family History  Problem Relation Age of Onset  . Diabetes Mother   . Hyperlipidemia Mother   . Hyperlipidemia Father     History  Substance Use Topics  . Smoking status: Former Games developer  . Smokeless tobacco: Not on file  . Alcohol Use: No    OB History    Grav Para Term Preterm Abortions TAB SAB Ect Mult Living   5 5 4 1  0 0 0 0 0 4      Review of Systems  All other systems reviewed and are negative.    Allergies  Review of patient's allergies indicates no known allergies.  Home Medications   Current Outpatient Rx  Name Route Sig Dispense Refill  .  ACYCLOVIR 5 % EX CREA Topical Apply 1 application topically every 3 (three) hours. 15 g 0  . CALCIUM CARBONATE ANTACID 500 MG PO CHEW Oral Chew 2 tablets (400 mg of elemental calcium total) by mouth every 4 (four) hours as needed. 31 tablet 0  . FERROUS SULFATE 325 (65 FE) MG PO TABS Oral Take 1 tablet (325 mg total) by mouth 3 (three) times daily with meals. 90 tablet 3  . PRENATAL PLUS 27-1 MG PO TABS Oral Take 1 tablet by mouth daily.        BP 135/85  Pulse 98  Temp 98.6 F (37 C) (Oral)  Resp 22  SpO2 100%  LMP 01/21/2012  Physical Exam  Nursing note and vitals reviewed. Constitutional: She is oriented to person, place, and time. Vital signs are normal. She appears well-developed and well-nourished. She is active and cooperative.  HENT:  Head: Normocephalic. No trismus in the jaw.  Right Ear: Hearing, external ear and ear canal normal. Tympanic membrane is injected.  Left Ear: Hearing, external ear and ear canal normal. Tympanic membrane is injected.  Nose: Rhinorrhea present.  Mouth/Throat: Uvula is midline and mucous membranes are normal. No uvula swelling. Oropharyngeal exudate, posterior oropharyngeal edema and posterior oropharyngeal erythema present. No tonsillar abscesses.  Eyes: Pupils are equal, round, and reactive  to light. Right eye exhibits no discharge and no exudate. Left eye exhibits no discharge and no exudate. Right conjunctiva is injected. Left conjunctiva is injected. No scleral icterus.  Neck: Trachea normal, normal range of motion and full passive range of motion without pain. Neck supple. No muscular tenderness present. No tracheal deviation present.  Cardiovascular: Normal rate, regular rhythm, normal heart sounds and normal pulses.   Pulmonary/Chest: Effort normal and breath sounds normal.  Abdominal: Normal appearance and bowel sounds are normal. There is no tenderness.  Lymphadenopathy:       Head (right side): Submandibular and tonsillar adenopathy  present.       Head (left side): Submandibular and tonsillar adenopathy present.    She has cervical adenopathy.       Right cervical: Superficial cervical adenopathy present.       Left cervical: Superficial cervical adenopathy present.  Neurological: She is alert and oriented to person, place, and time. No cranial nerve deficit or sensory deficit.  Skin: Skin is warm and dry. No rash noted.  Psychiatric: She has a normal mood and affect. Her speech is normal and behavior is normal. Judgment and thought content normal. Cognition and memory are normal.    ED Course  Procedures (including critical care time)   Labs Reviewed  POCT RAPID STREP A ([facility] ONLY)   No results found.   No diagnosis found.    MDM  Rapid strep +, bicillin la 1.20million administered in office in addition to tylenol.  Increase fluids, RTC as needed.        Johnsie Kindred, NP 02/04/12 2145

## 2012-02-04 NOTE — ED Notes (Signed)
C/c pt complaints of ear,nose and throat pain, started yesterday. No fever. No cough, runny nose. Throat hurst when swallow. Pt states discomfort all day. Pt having headaches.watery eyes. Pt has been taking ibuphrofen.

## 2012-02-05 NOTE — ED Provider Notes (Signed)
Medical screening examination/treatment/procedure(s) were performed by non-physician practitioner and as supervising physician I was immediately available for consultation/collaboration.  Linzy Darling, M.D.   Leny Morozov C Brantly Kalman, MD 02/05/12 1052 

## 2013-04-07 ENCOUNTER — Emergency Department (HOSPITAL_COMMUNITY)
Admission: EM | Admit: 2013-04-07 | Discharge: 2013-04-08 | Disposition: A | Discharge status: Home / Self Care | Payer: Medicaid Other | Attending: Emergency Medicine | Admitting: Emergency Medicine

## 2013-04-07 ENCOUNTER — Encounter (HOSPITAL_COMMUNITY): Payer: Self-pay | Admitting: Nurse Practitioner

## 2013-04-07 ENCOUNTER — Emergency Department (HOSPITAL_COMMUNITY): Payer: Medicaid Other

## 2013-04-07 DIAGNOSIS — Z87891 Personal history of nicotine dependence: Secondary | ICD-10-CM | POA: Insufficient documentation

## 2013-04-07 DIAGNOSIS — N83209 Unspecified ovarian cyst, unspecified side: Secondary | ICD-10-CM | POA: Insufficient documentation

## 2013-04-07 DIAGNOSIS — Z87828 Personal history of other (healed) physical injury and trauma: Secondary | ICD-10-CM | POA: Insufficient documentation

## 2013-04-07 DIAGNOSIS — Z9851 Tubal ligation status: Secondary | ICD-10-CM | POA: Insufficient documentation

## 2013-04-07 DIAGNOSIS — M549 Dorsalgia, unspecified: Secondary | ICD-10-CM | POA: Insufficient documentation

## 2013-04-07 DIAGNOSIS — Z862 Personal history of diseases of the blood and blood-forming organs and certain disorders involving the immune mechanism: Secondary | ICD-10-CM | POA: Insufficient documentation

## 2013-04-07 DIAGNOSIS — Z87440 Personal history of urinary (tract) infections: Secondary | ICD-10-CM | POA: Insufficient documentation

## 2013-04-07 DIAGNOSIS — Z9089 Acquired absence of other organs: Secondary | ICD-10-CM | POA: Insufficient documentation

## 2013-04-07 DIAGNOSIS — Z3202 Encounter for pregnancy test, result negative: Secondary | ICD-10-CM | POA: Insufficient documentation

## 2013-04-07 DIAGNOSIS — J45909 Unspecified asthma, uncomplicated: Secondary | ICD-10-CM | POA: Insufficient documentation

## 2013-04-07 LAB — CBC WITH DIFFERENTIAL/PLATELET
Basophils Absolute: 0 10*3/uL (ref 0.0–0.1)
Basophils Relative: 0 % (ref 0–1)
Eosinophils Absolute: 0.2 10*3/uL (ref 0.0–0.7)
Eosinophils Relative: 2 % (ref 0–5)
HCT: 33.8 % — ABNORMAL LOW (ref 36.0–46.0)
Hemoglobin: 11 g/dL — ABNORMAL LOW (ref 12.0–15.0)
Lymphocytes Relative: 22 % (ref 12–46)
Lymphs Abs: 1.8 10*3/uL (ref 0.7–4.0)
MCH: 22.8 pg — ABNORMAL LOW (ref 26.0–34.0)
MCHC: 32.5 g/dL (ref 30.0–36.0)
MCV: 70.1 fL — ABNORMAL LOW (ref 78.0–100.0)
Monocytes Absolute: 0.5 10*3/uL (ref 0.1–1.0)
Monocytes Relative: 6 % (ref 3–12)
Neutro Abs: 5.9 10*3/uL (ref 1.7–7.7)
Neutrophils Relative %: 70 % (ref 43–77)
Platelets: 384 10*3/uL (ref 150–400)
RBC: 4.82 MIL/uL (ref 3.87–5.11)
RDW: 18.2 % — ABNORMAL HIGH (ref 11.5–15.5)
WBC: 8.3 10*3/uL (ref 4.0–10.5)

## 2013-04-07 LAB — COMPREHENSIVE METABOLIC PANEL
AST: 17 U/L (ref 0–37)
BUN: 9 mg/dL (ref 6–23)
CO2: 27 mEq/L (ref 19–32)
Calcium: 9.1 mg/dL (ref 8.4–10.5)
Chloride: 98 mEq/L (ref 96–112)
Creatinine, Ser: 0.75 mg/dL (ref 0.50–1.10)
GFR calc Af Amer: >90 mL/min (ref >90)
GFR calc non Af Amer: >90 mL/min (ref >90)
Total Protein: 8.1 g/dL (ref 6.0–8.3)

## 2013-04-07 LAB — URINALYSIS, ROUTINE W REFLEX MICROSCOPIC
Bilirubin Urine: NEGATIVE
Glucose, UA: NEGATIVE mg/dL
Hgb urine dipstick: NEGATIVE
Ketones, ur: NEGATIVE mg/dL
Nitrite: NEGATIVE
Protein, ur: NEGATIVE mg/dL
Specific Gravity, Urine: 1.007 (ref 1.005–1.030)
Urobilinogen, UA: 0.2 mg/dL (ref 0.0–1.0)
pH: 8 (ref 5.0–8.0)

## 2013-04-07 LAB — WET PREP, GENITAL
Trich, Wet Prep: NONE SEEN
Yeast Wet Prep HPF POC: NONE SEEN

## 2013-04-07 LAB — URINE MICROSCOPIC-ADD ON

## 2013-04-07 MED ORDER — OXYCODONE-ACETAMINOPHEN 5-325 MG PO TABS
2.0000 | ORAL_TABLET | Freq: Once | ORAL | Status: AC
  Administered 2013-04-07: 2 via ORAL
  Filled 2013-04-07: qty 2

## 2013-04-07 NOTE — ED Provider Notes (Signed)
Coleta Grosshans S 8:00 PM. Patient discussed in sign out. Patient with lower pelvic pains having left adnexal tenderness on pelvic exam. Due to her discomfort ultrasound was ordered to rule out emergent condition such as torsion. Plan to await results and dispo appropriately.   10:00PM ultrasound results are not crossing over in the system. I was able to find the radiology report. Essentially examination was not adequate due to some discomfort but per Marcy Salvo ultrasound tech it was mostly due to body habitus and significant amounts of gas in the bowels. He reported being able to manipulate very well just unable to visualize ovaries.  10:15PM On reexam of the patient she appears comfortable in no acute distress. She is reporting some continued pains. She did report using the restroom after returning and having some gas which improved her symptoms. A chaperone was present and I performed a rectal exam without evidence of a fecal impaction. She did have some additional gas. This time I will have patient refill her bladder and give one more attempt at visualizing the ovaries.  12:00AM Marcy Salvo was able to re-evaluate and locate left ovary.  There was good doppler blood flow demonstrated.  There was also evidence of a 2 cm ovarian cyst.  At this time patient may be d/c home.  Will provide Rx [facility] perc[facility]et [facility].    Angus Seller, PA-C 04/08/13 [phone]

## 2013-04-07 NOTE — ED Notes (Addendum)
Pt c/o LLQ abd pain with nausea since waking this am. No bowel/bladder changes.

## 2013-04-07 NOTE — ED Provider Notes (Signed)
CSN: [id number]     Arrival date & time 04/07/13  1655 History   First MD Initiated Contact with Patient 04/07/13 1723     Chief Complaint  Patient presents with  . Abdominal Pain   (Consider location/radiation/quality/duration/timing/severity/associated sxs/prior Treatment) HPI Comments: Patient is a 32 year old female who presents with abdominal pain since this morning. The pain is located in her LLQ and radiates to her left back. The pain is described as aching and severe. The pain started gradually and progressively worsened since the onset. No alleviating/aggravating factors. The patient has tried nothing for symptoms without relief. Associated symptoms include nothing. Patient denies fever, headache, NVD, chest pain, SOB, dysuria, constipation, abnormal vaginal bleeding/discharge.      Past Medical History  Diagnosis Date  . Sickle cell anemia     trait  . Trauma     1997-1999  . Sickle cell trait   . Asthma     more as a child  . Urinary tract infection   . TOBACCO USER 07/16/2009   Past Surgical History  Procedure Laterality Date  . Appendectomy    . Cesarean section      x4  . Cesarean section  04/01/2011    Procedure: CESAREAN SECTION;  Surgeon: Scheryl Darter, MD;  Location: [facility];  Service: Gynecology;  Laterality: Bilateral;  Repeat Cesarean Section With Bilateral Tubal Ligation Filshie Clips  . Tubal ligation     Family History  Problem Relation Age of Onset  . Diabetes Mother   . Hyperlipidemia Mother   . Hyperlipidemia Father    History  Substance Use Topics  . Smoking status: Former Games developer  . Smokeless tobacco: Not on file  . Alcohol Use: No   OB History   Grav Para Term Preterm Abortions TAB SAB Ect Mult Living   5 5 4 1  0 0 0 0 0 4     Review of Systems  Gastrointestinal: Positive for abdominal pain.  Musculoskeletal: Positive for back pain.  All other systems reviewed and are negative.    Allergies  Review of patient's allergies indicates no  known allergies.  Home Medications  No current outpatient prescriptions on file. BP 143/99  Pulse 95  Temp(Src) 99.1 F (37.3 C) (Oral)  Resp 16  Ht 5\' 4"  (1.626 m)  Wt 225 lb (102.059 kg)  BMI 38.6 kg/m2  SpO2 100% Physical Exam  Nursing note and vitals reviewed. Constitutional: She is oriented to person, place, and time. She appears well-developed and well-nourished. No distress.  HENT:  Head: Normocephalic and atraumatic.  Eyes: Conjunctivae are normal.  Neck: Normal range of motion.  Cardiovascular: Normal rate and regular rhythm.  Exam reveals no gallop and no friction rub.   No murmur heard. Pulmonary/Chest: Effort normal and breath sounds normal. She has no wheezes. She has no rales. She exhibits no tenderness.  Abdominal: Soft. She exhibits no distension. There is tenderness. There is no rebound and no guarding.  Obese abdomen. LLQ tenderness to palpation. No peritoneal signs or focal tenderness to palpation.   Genitourinary: Vagina normal.  Normal external genitalia. Moderate amount of thick, white vaginal discharge. No CMT. LLQ tenderness to palpation on bimanual exam.   Musculoskeletal: Normal range of motion.  Neurological: She is alert and oriented to person, place, and time. Coordination normal.  Speech is goal-oriented. Moves limbs without ataxia.   Skin: Skin is warm and dry.  Psychiatric: She has a normal mood and affect. Her behavior is normal.  ED Course  Procedures (including critical care time) Labs Review Labs Reviewed  WET PREP, GENITAL - Abnormal; Notable for the following:    Clue Cells Wet Prep HPF POC FEW (*)    All other components within normal limits  CBC WITH DIFFERENTIAL - Abnormal; Notable for the following:    Hemoglobin 11.0 (*)    HCT 33.8 (*)    MCV 70.1 (*)    MCH 22.8 (*)    RDW 18.2 (*)    All other components within normal limits  COMPREHENSIVE METABOLIC PANEL - Abnormal; Notable for the following:    Sodium 133 (*)     Albumin 3.4 (*)    Total Bilirubin 0.2 (*)    All other components within normal limits  URINALYSIS, ROUTINE W REFLEX MICROSCOPIC - Abnormal; Notable for the following:    Leukocytes, UA TRACE (*)    All other components within normal limits  URINE CULTURE  GC/CHLAMYDIA PROBE AMP  URINE MICROSCOPIC-ADD ON  POCT PREGNANCY, URINE   Imaging Review No results found.  MDM  No diagnosis found.  6:59 PM Labs and urinalysis unremarkable for acute changes. Patient will have US pelvis. Patient will have morphine and zofran for symptoms. Vitals stable and patient afebrile.   8:10 PM  Wet prep shows few clue cells. Patient signed out to Ivonne Andrew, PA-C pending pelvic US results.    Emilia Beck, PA-C 04/07/13 2011

## 2013-04-08 LAB — URINE CULTURE: Colony Count: NO GROWTH

## 2013-04-08 MED ORDER — OXYCODONE-ACETAMINOPHEN 5-325 MG PO TABS: 1.0000 | ORAL_TABLET | ORAL | Status: AC | PRN

## 2013-04-11 NOTE — ED Provider Notes (Signed)
Medical screening examination/treatment/procedure(s) were performed by non-physician practitioner and as supervising physician I was immediately available for consultation/collaboration.   Shelda Jakes, MD 04/11/13 2001

## 2013-04-17 NOTE — ED Provider Notes (Signed)
Medical screening examination/treatment/procedure(s) were performed by non-physician practitioner and as supervising physician I was immediately available for consultation/collaboration.   Charnel Giles J Amadeo Coke, MD 04/17/13 0820 

## 2014-03-22 ENCOUNTER — Emergency Department (HOSPITAL_COMMUNITY)
Admission: EM | Admit: 2014-03-22 | Discharge: 2014-03-22 | Disposition: A | Discharge status: Home / Self Care | Payer: Medicaid Other | Attending: Emergency Medicine | Admitting: Emergency Medicine

## 2014-03-22 ENCOUNTER — Encounter (HOSPITAL_COMMUNITY): Payer: Self-pay | Admitting: Emergency Medicine

## 2014-03-22 DIAGNOSIS — R509 Fever, unspecified: Secondary | ICD-10-CM | POA: Insufficient documentation

## 2014-03-22 DIAGNOSIS — K006 Disturbances in tooth eruption: Secondary | ICD-10-CM | POA: Insufficient documentation

## 2014-03-22 DIAGNOSIS — Z87828 Personal history of other (healed) physical injury and trauma: Secondary | ICD-10-CM | POA: Insufficient documentation

## 2014-03-22 DIAGNOSIS — J45909 Unspecified asthma, uncomplicated: Secondary | ICD-10-CM | POA: Insufficient documentation

## 2014-03-22 DIAGNOSIS — F17200 Nicotine dependence, unspecified, uncomplicated: Secondary | ICD-10-CM | POA: Insufficient documentation

## 2014-03-22 DIAGNOSIS — Z87440 Personal history of urinary (tract) infections: Secondary | ICD-10-CM | POA: Insufficient documentation

## 2014-03-22 DIAGNOSIS — Z862 Personal history of diseases of the blood and blood-forming organs and certain disorders involving the immune mechanism: Secondary | ICD-10-CM | POA: Insufficient documentation

## 2014-03-22 DIAGNOSIS — K089 Disorder of teeth and supporting structures, unspecified: Secondary | ICD-10-CM | POA: Insufficient documentation

## 2014-03-22 DIAGNOSIS — K047 Periapical abscess without sinus: Secondary | ICD-10-CM | POA: Insufficient documentation

## 2014-03-22 MED ORDER — OXYCODONE-ACETAMINOPHEN 5-325 MG PO TABS: 2.0000 | ORAL_TABLET | Freq: Four times a day (QID) | ORAL | Status: AC | PRN

## 2014-03-22 MED ORDER — OXYCODONE-ACETAMINOPHEN 5-325 MG PO TABS
1.0000 | ORAL_TABLET | Freq: Once | ORAL | Status: AC
  Administered 2014-03-22: 1 via ORAL
  Filled 2014-03-22: qty 1

## 2014-03-22 MED ORDER — PENICILLIN V POTASSIUM 250 MG PO TABS: 250.0000 mg | ORAL_TABLET | Freq: Four times a day (QID) | ORAL | Status: AC

## 2014-03-22 NOTE — ED Provider Notes (Signed)
CSN: [id number]     Arrival date & time 03/22/14  0917 History  This chart was scribed for non-physician practitioner, Roxy Horseman, PA-C working with Vanetta Mulders, MD by Greggory Stallion, ED scribe. This patient was seen in room TR07C/TR07C and the patient's care was started at 9:39 AM.   Chief Complaint  Patient presents with  . Dental Pain   The history is provided by the patient. No language interpreter was used.   HPI Comments: Cheryl Mcfarland is a 33 y.o. female who presents to the Emergency Department complaining of worsening left lower dental pain with associated facial swelling that started yesterday. States she is having pain to the entire left side of her face. Reports subjective fever. Denies chills. Pt has been hospitalized in 2010 for the same dental abscess but states she has not been able to afford to get the tooth pulled. She does not have a Education officer, community.   Past Medical History  Diagnosis Date  . Sickle cell anemia     trait  . Trauma     1997-1999  . Sickle cell trait   . Asthma     more as a child  . Urinary tract infection   . TOBACCO USER 07/16/2009   Past Surgical History  Procedure Laterality Date  . Appendectomy    . Cesarean section      x4  . Cesarean section  04/01/2011    Procedure: CESAREAN SECTION;  Surgeon: Scheryl Darter, MD;  Location: [facility];  Service: Gynecology;  Laterality: Bilateral;  Repeat Cesarean Section With Bilateral Tubal Ligation Filshie Clips  . Tubal ligation     Family History  Problem Relation Age of Onset  . Diabetes Mother   . Hyperlipidemia Mother   . Hyperlipidemia Father    History  Substance Use Topics  . Smoking status: Current Every Day Smoker  . Smokeless tobacco: Not on file  . Alcohol Use: Yes     Comment: occ   OB History   Grav Para Term Preterm Abortions TAB SAB Ect Mult Living   0 0 0 0 0 4     Review of Systems  Constitutional: Positive for fever. Negative for chills.  HENT: Positive for dental  problem and facial swelling.   Eyes: Negative for redness.  Respiratory: Negative for shortness of breath.   Cardiovascular: Negative for chest pain.  Gastrointestinal: Negative for abdominal distention.  Musculoskeletal: Negative for gait problem.  Skin: Negative for rash.  Neurological: Negative for speech difficulty.  Psychiatric/Behavioral: Negative for confusion.   Allergies  Review of patient's allergies indicates no known allergies.  Home Medications   Prior to Admission medications   Medication Sig Start Date End Date Taking? Authorizing Provider  oxyCODONE-acetaminophen (PERCOCET) 5-325 MG per tablet Take 1 tablet by mouth every 4 (four) hours as needed for pain. 04/08/13   Phill Mutter Dammen, PA-C   BP 156/91  Pulse 86  Temp(Src) 99 F (37.2 C) (Oral)  Resp 16  SpO2 98%  Physical Exam  Nursing note and vitals reviewed. Constitutional: She is oriented to person, place, and time. She appears well-developed and well-nourished. No distress.  HENT:  Head: Normocephalic and atraumatic.  Mouth/Throat:    Poor dentition throughout.  Affected tooth as diagrammed.  No signs of peritonsillar or tonsillar abscess.  No signs of gingival abscess. Oropharynx is clear and without exudates.  Uvula is midline.  Airway is intact. No signs of Ludwig's angina with palpation of oral and sublingual  mucosa, minimal trismus   Eyes: Conjunctivae and EOM are normal.  Cardiovascular: Normal rate and regular rhythm.   Pulmonary/Chest: Effort normal and breath sounds normal. No stridor. No respiratory distress.  Abdominal: She exhibits no distension.  Musculoskeletal: She exhibits no edema.  Neurological: She is alert and oriented to person, place, and time. No cranial nerve deficit.  Skin: Skin is warm and dry.  Psychiatric: She has a normal mood and affect.    ED Course  Procedures (including critical care time)  DIAGNOSTIC STUDIES: Oxygen Saturation is 98% on RA, normal by my  interpretation.    COORDINATION OF CARE: 9:41 AM-Discussed treatment plan which includes an antibiotic and pain medication with pt at bedside and pt agreed to plan. Will give pt dental referrals and advised her to follow up. Return precautions given.   Labs Review Labs Reviewed - No data to display  Imaging Review No results found.   EKG Interpretation None      MDM   Final diagnoses:  Dental abscess    Patient with toothache.  No gross abscess.  Exam unconcerning for Ludwig's angina or spread of infection at this time.  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  Discussed the patient with Dr. Deretha Emory, who agrees that typical treatment with abx, pain meds, dental referral, and return precautions is appropriate at this time.  Will treat with penicillin and pain medicine.  Urged patient to follow-up with dentist.     I personally performed the services described in this documentation, which was scribed in my presence. The recorded information has been reviewed and is accurate.  Roxy Horseman, PA-C 03/22/14 1005

## 2014-03-22 NOTE — ED Notes (Signed)
Pt started with left lower dental pain yesterday. Today has left facial swelling and pain.

## 2014-03-22 NOTE — ED Provider Notes (Signed)
Medical screening examination/treatment/procedure(s) were performed by non-physician practitioner and as supervising physician I was immediately available for consultation/collaboration.   EKG Interpretation None        Lorel Lembo, MD 03/22/14 1234 

## 2014-03-22 NOTE — ED Notes (Signed)
Pt c/o left lower dental pain x 2 days with some swelling 

## 2014-03-22 NOTE — Discharge Instructions (Signed)
Please call the dentist when you get home.  Return if you have worsening symptoms.  Dental Abscess A dental abscess is a collection of infected fluid (pus) from a bacterial infection in the inner part of the tooth (pulp). It usually occurs at the end of the tooth's root.  CAUSES   Severe tooth decay.  Trauma to the tooth that allows bacteria to enter into the pulp, such as a broken or chipped tooth. SYMPTOMS   Severe pain in and around the infected tooth.  Swelling and redness around the abscessed tooth or in the mouth or face.  Tenderness.  Pus drainage.  Bad breath.  Bitter taste in the mouth.  Difficulty swallowing.  Difficulty opening the mouth.  Nausea.  Vomiting.  Chills.  Swollen neck glands. DIAGNOSIS   A medical and dental history will be taken.  An examination will be performed by tapping on the abscessed tooth.  X-rays may be taken of the tooth to identify the abscess. TREATMENT The goal of treatment is to eliminate the infection. You may be prescribed antibiotic medicine to stop the infection from spreading. A root canal may be performed to save the tooth. If the tooth cannot be saved, it may be pulled (extracted) and the abscess may be drained.  HOME CARE INSTRUCTIONS  Only take over-the-counter or prescription medicines for pain, fever, or discomfort as directed by your caregiver.  Rinse your mouth (gargle) often with salt water ( tsp salt in 8 oz [250 ml] of warm water) to relieve pain or swelling.  Do not drive after taking pain medicine (narcotics).  Do not apply heat to the outside of your face.  Return to your dentist for further treatment as directed. SEEK MEDICAL CARE IF:  Your pain is not helped by medicine.  Your pain is getting worse instead of better. SEEK IMMEDIATE MEDICAL CARE IF:  You have a fever or persistent symptoms for more than 2-3 days.  You have a fever and your symptoms suddenly get worse.  You have chills or a  very bad headache.  You have problems breathing or swallowing.  You have trouble opening your mouth.  You have swelling in the neck or around the eye. Document Released: 06/30/2005 Document Revised: 03/24/2012 Document Reviewed: 10/08/2010 [facility] Patient Information 2015 [place], [place]. This information is not intended to replace advice given to you by your health care provider. Make sure you discuss any questions you have with your health care provider.

## 2014-05-15 ENCOUNTER — Encounter (HOSPITAL_COMMUNITY): Payer: Self-pay | Admitting: Emergency Medicine

## 2015-05-25 IMAGING — US US ART/VEN ABD/PELV/SCROTUM DOPPLER LTD
1 series · 13 of 25 positions shown · non-contrast
Comparison: No recent similar comparison is available.

CLINICAL DATA: Left lower quadrant pelvic and abdominal pain.
Concern for ovarian torsion.  Prior tubal ligation.

TRANSABDOMINAL AND TRANSVAGINAL ULTRASOUND OF PELVIS
DOPPLER ULTRASOUND OF OVARIES
TECHNIQUE: Both transabdominal and transvaginal ultrasound
examinations of the pelvis were performed. Transabdominal technique
was performed for global imaging of the pelvis including uterus,
ovaries, adnexal regions, and pelvic cul-de-sac.
It was necessary to proceed with endovaginal exam following the
transabdominal exam to visualize the endometrium and ovaries.
Color and duplex Doppler ultrasound was utilized to evaluate blood
flow to the ovaries.

[Series 1: us art/ven abd/pelv/scrotum doppler ltd · 0.24mm/px · 13 of 26 slices shown]
[im 1/26]
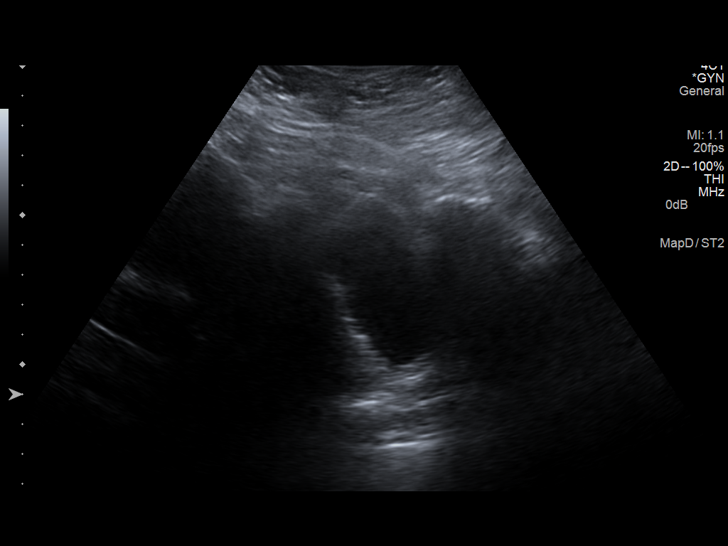
[im 3/26]
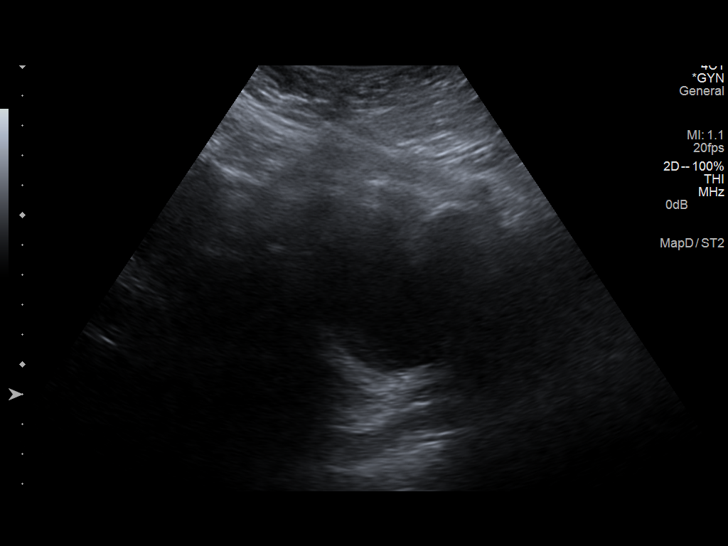
[im 5/26]
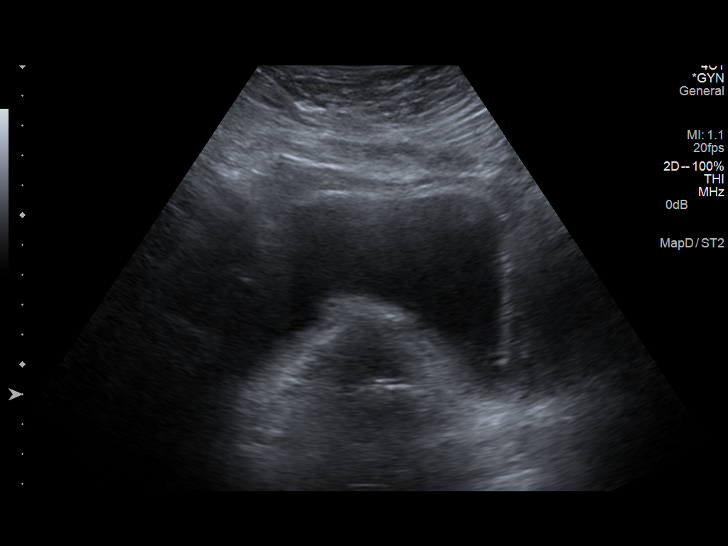
[im 7/26]
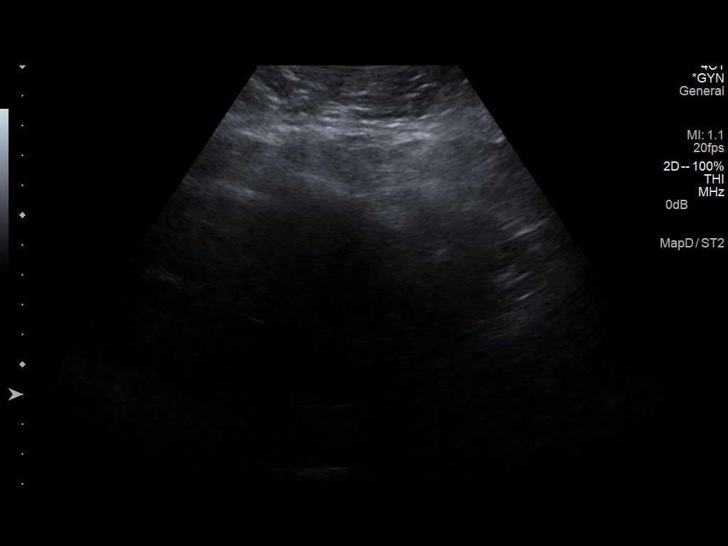
[im 9/26]
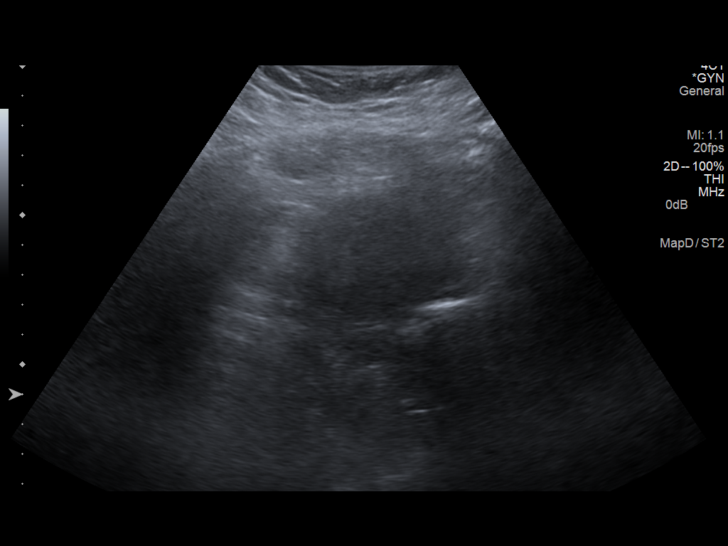
[im 11/26]
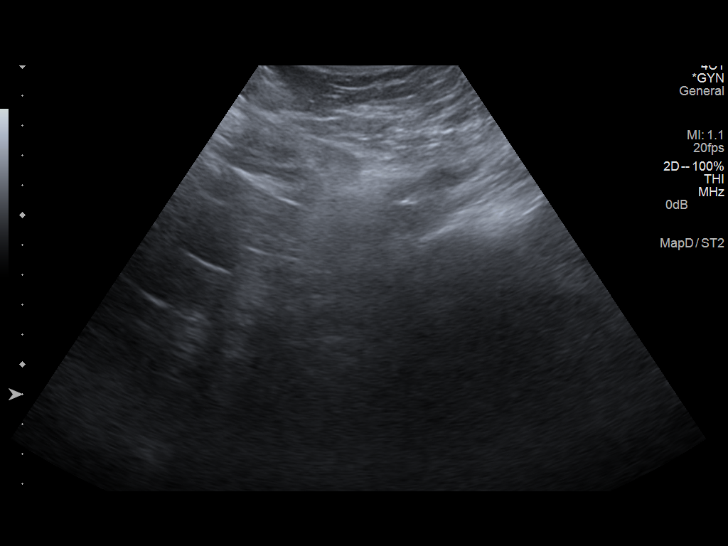
[im 13/26]
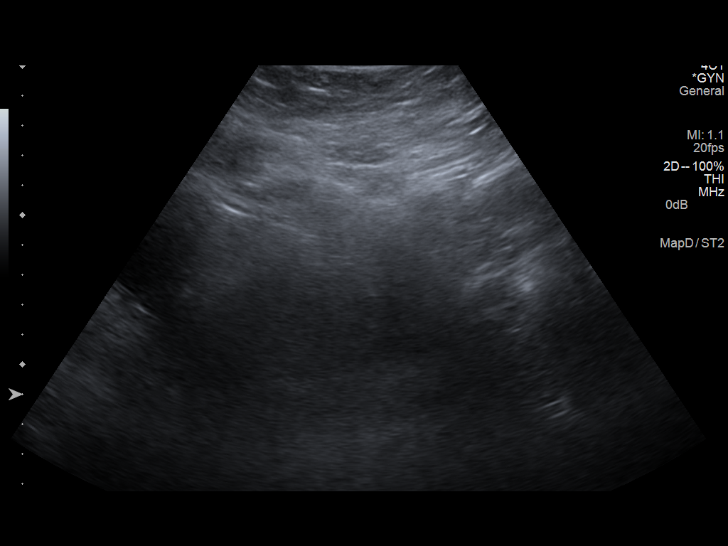
[im 15/26]
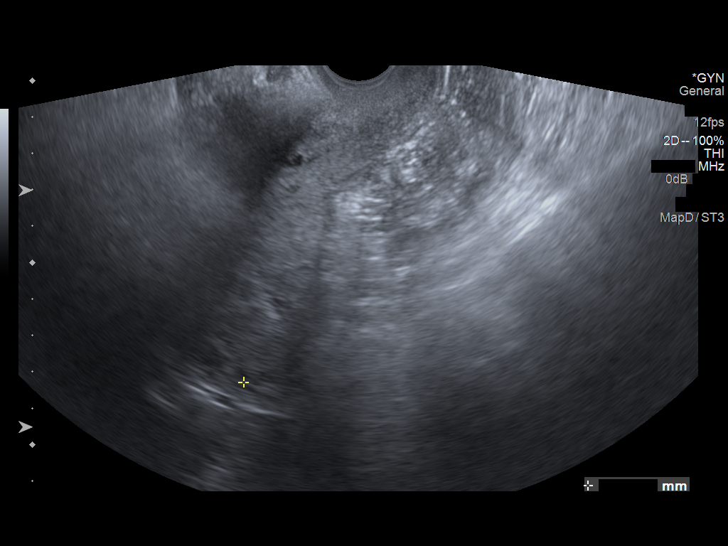
[im 17/26]
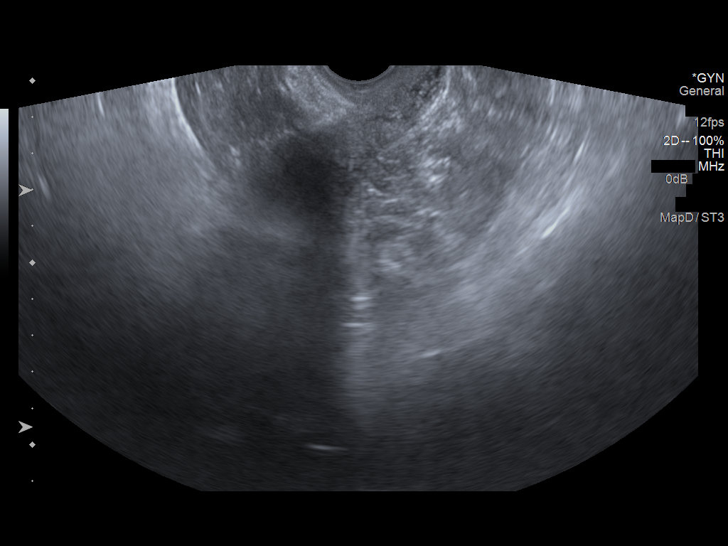
[im 19/26]
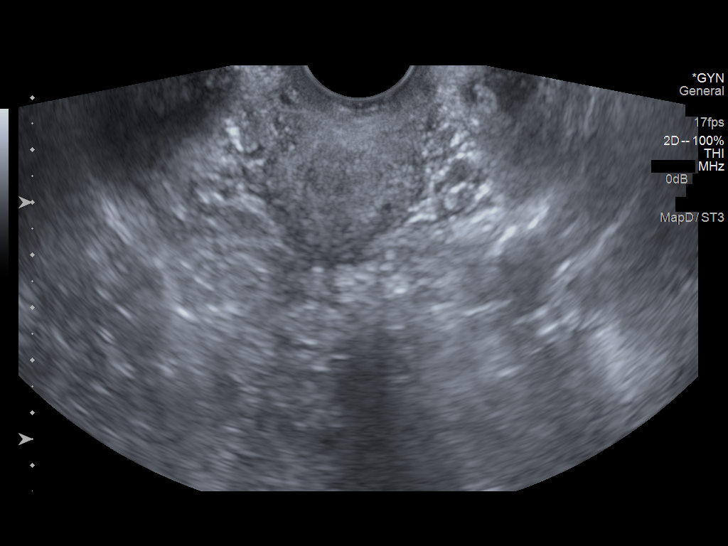
[im 21/26]
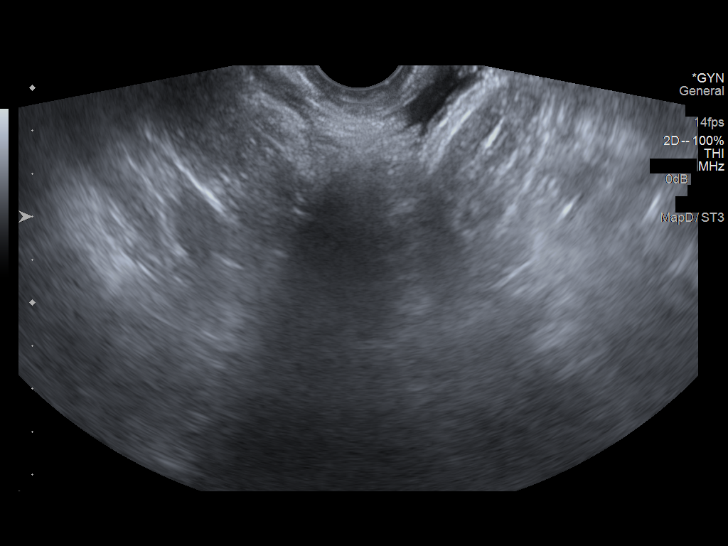
[im 23/26]
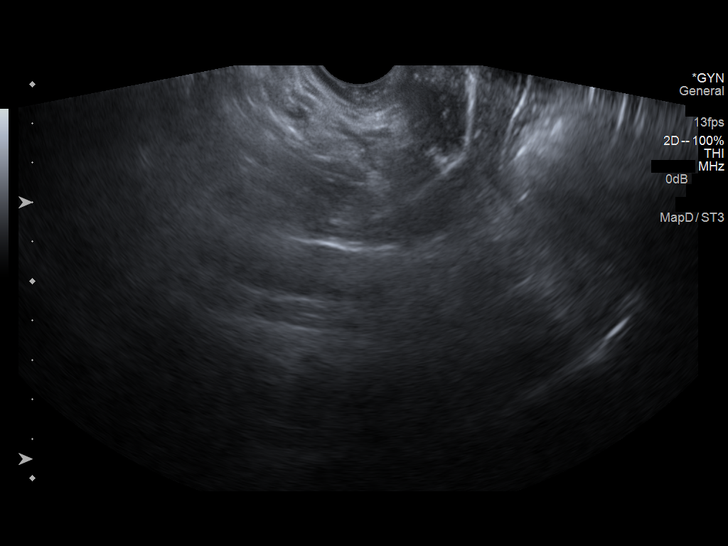
[im 26/26]
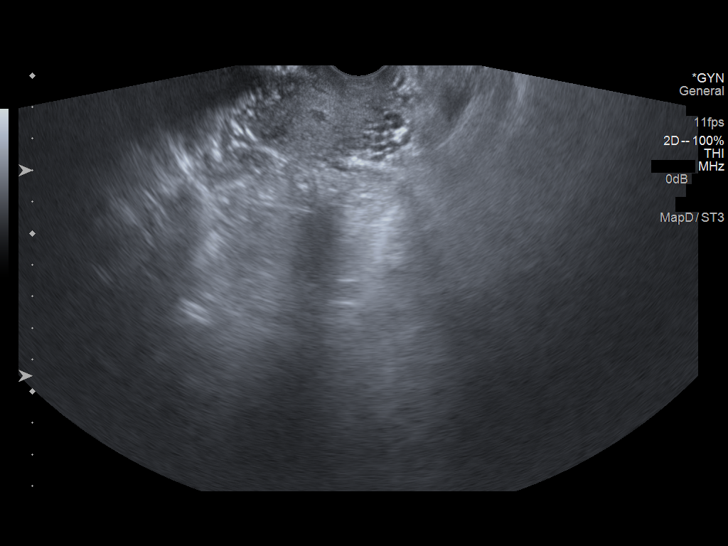

[13 of 25 positions shown; findings below may reference images not displayed]

FINDINGS: Uterus:  Not well visualized due to patient inability to tolerate
the exam both transabdominally and transvaginally.

Endometrium: Suboptimally visualized. 8 mm, echogenic and
homogeneous where visualized but poorly visualized due to the
patient's inability to tolerate transabdominal and transvaginal
imaging.

Right ovary: Not visualized.

Left ovary: Not visualized.

Pulsed Doppler evaluation was attempted but could not be
successfully performed because the patient was unable to tolerate
transabdominal or transvaginal imaging.
IMPRESSION: Essentially nondiagnostic examination with the exception of partial
visualization of endometrium, normal where visualized.  The patient
was unable to tolerate transabdominal or transvaginal imaging due
to tenderness.

Doppler interrogation was attempted but could not be successfully
performed.

## 2015-11-07 ENCOUNTER — Telehealth: Payer: Self-pay

## 2015-11-07 NOTE — Telephone Encounter (Signed)
Error. Kyren Vaux,CMA
